# Patient Record
Sex: Female | Born: 1935 | Race: White | Hispanic: No | Marital: Single | Smoking: Never smoker
Health system: Southern US, Community
[De-identification: ages and names within clinical notes are randomized; demographics above are authoritative.]

## PROBLEM LIST (undated history)

## (undated) DIAGNOSIS — M549 Dorsalgia, unspecified: Secondary | ICD-10-CM

## (undated) DIAGNOSIS — K589 Irritable bowel syndrome without diarrhea: Secondary | ICD-10-CM

## (undated) DIAGNOSIS — Z9889 Other specified postprocedural states: Secondary | ICD-10-CM

## (undated) DIAGNOSIS — G8929 Other chronic pain: Secondary | ICD-10-CM

## (undated) DIAGNOSIS — M1990 Unspecified osteoarthritis, unspecified site: Secondary | ICD-10-CM

## (undated) DIAGNOSIS — K5790 Diverticulosis of intestine, part unspecified, without perforation or abscess without bleeding: Secondary | ICD-10-CM

## (undated) DIAGNOSIS — M810 Age-related osteoporosis without current pathological fracture: Secondary | ICD-10-CM

## (undated) DIAGNOSIS — N200 Calculus of kidney: Secondary | ICD-10-CM

## (undated) DIAGNOSIS — R112 Nausea with vomiting, unspecified: Secondary | ICD-10-CM

## (undated) DIAGNOSIS — C801 Malignant (primary) neoplasm, unspecified: Secondary | ICD-10-CM

## (undated) DIAGNOSIS — K219 Gastro-esophageal reflux disease without esophagitis: Secondary | ICD-10-CM

## (undated) DIAGNOSIS — M940 Chondrocostal junction syndrome [Tietze]: Secondary | ICD-10-CM

## (undated) DIAGNOSIS — E7800 Pure hypercholesterolemia, unspecified: Secondary | ICD-10-CM

## (undated) HISTORY — DX: Pure hypercholesterolemia, unspecified: E78.00

## (undated) HISTORY — PX: TONSILLECTOMY: SUR1361

## (undated) HISTORY — PX: COLONOSCOPY: SHX174

## (undated) HISTORY — PX: ABDOMINAL HYSTERECTOMY: SHX81

## (undated) HISTORY — DX: Diverticulosis of intestine, part unspecified, without perforation or abscess without bleeding: K57.90

## (undated) HISTORY — PX: EYE SURGERY: SHX253

## (undated) HISTORY — DX: Gastro-esophageal reflux disease without esophagitis: K21.9

## (undated) HISTORY — DX: Irritable bowel syndrome, unspecified: K58.9

## (undated) HISTORY — PX: FACIAL COSMETIC SURGERY: SHX629

## (undated) HISTORY — PX: CHOLECYSTECTOMY: SHX55

## (undated) HISTORY — DX: Age-related osteoporosis without current pathological fracture: M81.0

## (undated) HISTORY — PX: BREAST SURGERY: SHX581

---

## 1998-04-28 ENCOUNTER — Other Ambulatory Visit: Admission: RE | Admit: 1998-04-28 | Discharge: 1998-04-28 | Payer: Self-pay | Admitting: Obstetrics and Gynecology

## 1998-08-18 ENCOUNTER — Other Ambulatory Visit: Admission: RE | Admit: 1998-08-18 | Discharge: 1998-08-18 | Payer: Self-pay | Admitting: Obstetrics and Gynecology

## 1998-08-27 ENCOUNTER — Encounter: Payer: Self-pay | Admitting: Obstetrics and Gynecology

## 1998-08-31 ENCOUNTER — Inpatient Hospital Stay (HOSPITAL_COMMUNITY): Admission: RE | Admit: 1998-08-31 | Discharge: 1998-09-03 | Payer: Self-pay | Admitting: Obstetrics and Gynecology

## 1999-01-13 ENCOUNTER — Other Ambulatory Visit: Admission: RE | Admit: 1999-01-13 | Discharge: 1999-01-13 | Payer: Self-pay | Admitting: Obstetrics and Gynecology

## 1999-04-15 ENCOUNTER — Other Ambulatory Visit: Admission: RE | Admit: 1999-04-15 | Discharge: 1999-04-15 | Payer: Self-pay | Admitting: Obstetrics and Gynecology

## 1999-07-13 ENCOUNTER — Encounter: Admission: RE | Admit: 1999-07-13 | Discharge: 1999-07-13 | Payer: Self-pay | Admitting: Family Medicine

## 1999-07-13 ENCOUNTER — Encounter: Payer: Self-pay | Admitting: Family Medicine

## 1999-07-14 ENCOUNTER — Other Ambulatory Visit: Admission: RE | Admit: 1999-07-14 | Discharge: 1999-07-14 | Payer: Self-pay | Admitting: Obstetrics and Gynecology

## 1999-10-12 ENCOUNTER — Other Ambulatory Visit: Admission: RE | Admit: 1999-10-12 | Discharge: 1999-10-12 | Payer: Self-pay | Admitting: Obstetrics and Gynecology

## 2000-04-28 ENCOUNTER — Other Ambulatory Visit: Admission: RE | Admit: 2000-04-28 | Discharge: 2000-04-28 | Payer: Self-pay | Admitting: Obstetrics and Gynecology

## 2000-10-16 ENCOUNTER — Other Ambulatory Visit: Admission: RE | Admit: 2000-10-16 | Discharge: 2000-10-16 | Payer: Self-pay | Admitting: Obstetrics and Gynecology

## 2001-01-16 ENCOUNTER — Other Ambulatory Visit: Admission: RE | Admit: 2001-01-16 | Discharge: 2001-01-16 | Payer: Self-pay | Admitting: Obstetrics and Gynecology

## 2001-09-16 ENCOUNTER — Encounter: Payer: Self-pay | Admitting: Family Medicine

## 2001-09-16 ENCOUNTER — Encounter: Admission: RE | Admit: 2001-09-16 | Discharge: 2001-09-16 | Payer: Self-pay | Admitting: Family Medicine

## 2001-10-17 ENCOUNTER — Other Ambulatory Visit: Admission: RE | Admit: 2001-10-17 | Discharge: 2001-10-17 | Payer: Self-pay | Admitting: Internal Medicine

## 2002-12-23 ENCOUNTER — Encounter: Payer: Self-pay | Admitting: Gastroenterology

## 2002-12-23 ENCOUNTER — Encounter: Admission: RE | Admit: 2002-12-23 | Discharge: 2002-12-23 | Payer: Self-pay | Admitting: Gastroenterology

## 2002-12-24 ENCOUNTER — Encounter: Payer: Self-pay | Admitting: Surgery

## 2002-12-25 ENCOUNTER — Observation Stay (HOSPITAL_COMMUNITY): Admission: RE | Admit: 2002-12-25 | Discharge: 2002-12-26 | Payer: Self-pay | Admitting: Surgery

## 2002-12-25 ENCOUNTER — Encounter: Payer: Self-pay | Admitting: Surgery

## 2002-12-25 ENCOUNTER — Encounter (INDEPENDENT_AMBULATORY_CARE_PROVIDER_SITE_OTHER): Payer: Self-pay | Admitting: Specialist

## 2007-03-09 ENCOUNTER — Ambulatory Visit (HOSPITAL_COMMUNITY): Admission: RE | Admit: 2007-03-09 | Discharge: 2007-03-09 | Payer: Self-pay | Admitting: Family Medicine

## 2007-03-22 ENCOUNTER — Encounter: Admission: RE | Admit: 2007-03-22 | Discharge: 2007-03-22 | Payer: Self-pay | Admitting: Family Medicine

## 2007-09-20 ENCOUNTER — Encounter: Admission: RE | Admit: 2007-09-20 | Discharge: 2007-09-20 | Payer: Self-pay | Admitting: Gastroenterology

## 2008-03-11 ENCOUNTER — Ambulatory Visit (HOSPITAL_COMMUNITY): Admission: RE | Admit: 2008-03-11 | Discharge: 2008-03-11 | Payer: Self-pay | Admitting: Family Medicine

## 2009-03-18 ENCOUNTER — Ambulatory Visit (HOSPITAL_COMMUNITY): Admission: RE | Admit: 2009-03-18 | Discharge: 2009-03-18 | Payer: Self-pay | Admitting: Family Medicine

## 2010-03-24 ENCOUNTER — Ambulatory Visit (HOSPITAL_COMMUNITY): Admission: RE | Admit: 2010-03-24 | Discharge: 2010-03-24 | Payer: Self-pay | Admitting: Family Medicine

## 2010-10-29 NOTE — Op Note (Signed)
NAME:  Yolanda Phillips, Yolanda Phillips                          ACCOUNT NO.:  [id number]   MEDICAL RECORD NO.:  [id number]                   PATIENT TYPE:  AMB   LOCATION:  DAY                                  FACILITY:  [facility]   PHYSICIAN:  Currie Paris, M.D.           DATE OF BIRTH:  17-Aug-1930   DATE OF PROCEDURE:  12/25/2002  DATE OF DISCHARGE:                                 OPERATIVE REPORT   OFFICE MEDICAL RECORD NUMBER:  [id number]   PREOPERATIVE DIAGNOSIS:  Chronic and subacute calculus cholecystitis.   POSTOPERATIVE DIAGNOSIS:  Chronic and subacute calculus cholecystitis.   OPERATION:  Laparoscopic cholecystectomy with operative cholangiogram.   SURGEON:  Currie Paris, M.D.   ASSISTANTMadilyn Fireman, RNFA.   ANESTHESIA:  General.   CLINICAL HISTORY:  This patient is a 77 year old lady, who has had known  gallstones for many years and initially had planned to have surgery a couple  of years ago but became less symptomatic and had other surgical  interventions that took precedence.  However, she has now had fairly severe  pain for several days which was gradually easing off at the time we saw her  yesterday in the office.  Ultrasound showed a tightly packed gallbladder  filled with stones as well as a thick wall, suggesting some acute  cholecystitis.  Clinically, she was not toxic, and we felt that this was  subacute cholecystitis overlying a chronic calculus cholecystitis.  However,  because of her continued symptomatology, we elected to proceed to surgery.   DESCRIPTION OF PROCEDURE:  The patient was seen in the holding area and had  no further questions.  She was taken to the operating room and after  satisfactory general endotracheal anesthesia had been obtained, the abdomen  was prepped and draped.  Marcaine 0.25% plain was used for each incision.  The umbilical incision was made, the fascia opened, and the peritoneal  cavity entered under direct vision.  A pursestring was  placed and the Hasson  introduced and the abdomen insufflated to 15.  The patient was placed in  reverse Trendelenburg and additional local infiltrated as I put a 10-11 in  the epigastrium and two 5 mm laterally.  The gallbladder was tense and  distended.  I tried to aspirate and got only a few drops of bile out, as it  was primary stones.  It was very thick and chronically inflamed.  We  eventually were able to get a little bit of grasper on the fundus of the  gallbladder and another one just above the cystic duct, but there was a  stone there so again, we were having trouble grasping.  I was able to open  the peritoneum over the triangle of Calot with the cautery.  I dissected a  little bit of thickened peritoneum off.  I then was able to do some  dissection to find what appeared to be posterior branch  of the cystic  artery, anterior branch of the cystic artery, and the cystic duct.  Once we  had those dissected out, a nice window made, I clipped the posterior branch  of the cystic artery since it was readily accessible and then clipped the  cystic duct at its junction with the gallbladder.  The cystic duct was  opened, and a Cook catheter introduced percutaneously and operative  cholangiography done.  The common duct was a little dilated but otherwise  appeared to be normal with good filling of the duodenum and no filling  defects in filling up of the hepatic radicles.   The catheter was removed and three clips placed on the stay side of the  cystic duct, and it was divided.  Two additional clips were placed on the  cystic artery, divided, and the anterior branch likewise clipped and divided  with two clips on the stay side.  The gallbladder was removed from below to  above with coagulation current of the cautery and again, it was a very thick  wall peritoneum of chronic inflammation but came out fairly straightforward.  Just before disconnecting, we irrigated and made sure everything  was dry.  It was placed in a bag, and brought out the umbilical port.  I had to make  the umbilical incision larger to get this out.  Once it was out, we  reinsufflated, irrigated, and made sure everything was dry and pulled out  the lateral ports.  The umbilical port was closed with the pursestring  suture plus an additional suture of Vicryl.  The abdomen was deflated  through the epigastric port.  The skin was closed with 4-0 Monocryl  subcuticular and Dermabond.   The patient tolerated the procedure well, and there were no operative  complications.  All counts were correct.                                               Currie Paris, M.D.    CJS/MEDQ  D:  12/25/2002  T:  12/25/2002  Job:  [id number]   cc:   Bryan Lemma. Manus Gunning, M.D.  [address] [place]  [place] [address]  Fax: [phone]   Llana Aliment. Malon Kindle., M.D.  [address]  [place]  [place] [address]  Fax: [phone]

## 2011-03-12 ENCOUNTER — Emergency Department (HOSPITAL_COMMUNITY): Payer: Medicare Other

## 2011-03-12 ENCOUNTER — Emergency Department (HOSPITAL_COMMUNITY)
Admission: EM | Admit: 2011-03-12 | Discharge: 2011-03-12 | Disposition: A | Discharge status: Home / Self Care | Payer: Medicare Other | Attending: Emergency Medicine | Admitting: Emergency Medicine

## 2011-03-12 DIAGNOSIS — R112 Nausea with vomiting, unspecified: Secondary | ICD-10-CM | POA: Insufficient documentation

## 2011-03-12 DIAGNOSIS — R1031 Right lower quadrant pain: Secondary | ICD-10-CM | POA: Insufficient documentation

## 2011-03-12 LAB — DIFFERENTIAL
Basophils Relative: 0 % (ref 0–1)
Eosinophils Absolute: 0.1 10*3/uL (ref 0.0–0.7)
Lymphs Abs: 2 10*3/uL (ref 0.7–4.0)
Neutro Abs: 10.1 10*3/uL — ABNORMAL HIGH (ref 1.7–7.7)
Neutrophils Relative %: 76 % (ref 43–77)

## 2011-03-12 LAB — URINALYSIS, ROUTINE W REFLEX MICROSCOPIC
Glucose, UA: NEGATIVE mg/dL
Hgb urine dipstick: NEGATIVE
Ketones, ur: NEGATIVE mg/dL
Leukocytes, UA: NEGATIVE
Protein, ur: NEGATIVE mg/dL
Urobilinogen, UA: 0.2 mg/dL (ref 0.0–1.0)

## 2011-03-12 LAB — COMPREHENSIVE METABOLIC PANEL
ALT: 22 U/L (ref 0–35)
AST: 31 U/L (ref 0–37)
Alkaline Phosphatase: 54 U/L (ref 39–117)
CO2: 26 mEq/L (ref 19–32)
Chloride: 103 mEq/L (ref 96–112)
GFR calc non Af Amer: >60 mL/min (ref >60)
Glucose, Bld: 111 mg/dL — ABNORMAL HIGH (ref 70–99)
Sodium: 140 mEq/L (ref 135–145)
Total Bilirubin: 0.5 mg/dL (ref 0.3–1.2)

## 2011-03-12 LAB — CBC
Hemoglobin: 15.7 g/dL — ABNORMAL HIGH (ref 12.0–15.0)
Platelets: 254 10*3/uL (ref 150–400)
RBC: 5.13 MIL/uL — ABNORMAL HIGH (ref 3.87–5.11)
WBC: 13.3 10*3/uL — ABNORMAL HIGH (ref 4.0–10.5)

## 2011-03-15 ENCOUNTER — Other Ambulatory Visit: Payer: Self-pay | Admitting: Gastroenterology

## 2011-03-16 ENCOUNTER — Ambulatory Visit
Admission: RE | Admit: 2011-03-16 | Discharge: 2011-03-16 | Disposition: A | Discharge status: Home / Self Care | Payer: Medicare Other | Source: Ambulatory Visit | Attending: Gastroenterology | Admitting: Gastroenterology

## 2011-03-16 MED ORDER — IOHEXOL 300 MG/ML  SOLN
100.0000 mL | Freq: Once | INTRAMUSCULAR | Status: AC | PRN
  Administered 2011-03-16: 100 mL via INTRAVENOUS

## 2011-05-09 ENCOUNTER — Other Ambulatory Visit (HOSPITAL_COMMUNITY): Payer: Self-pay | Admitting: *Deleted

## 2011-05-10 ENCOUNTER — Encounter (HOSPITAL_COMMUNITY): Payer: Medicare Other

## 2011-05-11 ENCOUNTER — Encounter (HOSPITAL_COMMUNITY)
Admission: RE | Admit: 2011-05-11 | Discharge: 2011-05-11 | Disposition: A | Discharge status: Home / Self Care | Payer: Medicare Other | Source: Ambulatory Visit | Attending: Family Medicine | Admitting: Family Medicine

## 2011-05-11 DIAGNOSIS — M810 Age-related osteoporosis without current pathological fracture: Secondary | ICD-10-CM | POA: Insufficient documentation

## 2011-05-11 MED ORDER — ZOLEDRONIC ACID 5 MG/100ML IV SOLN
5.0000 mg | Freq: Once | INTRAVENOUS | Status: AC
  Administered 2011-05-11: 5 mg via INTRAVENOUS
  Filled 2011-05-11: qty 100

## 2012-05-04 ENCOUNTER — Encounter (HOSPITAL_COMMUNITY): Payer: Medicare Other

## 2012-05-14 ENCOUNTER — Encounter (HOSPITAL_COMMUNITY): Payer: Medicare Other

## 2012-05-15 ENCOUNTER — Other Ambulatory Visit (HOSPITAL_COMMUNITY): Payer: Self-pay | Admitting: *Deleted

## 2012-05-16 ENCOUNTER — Ambulatory Visit (HOSPITAL_COMMUNITY)
Admission: RE | Admit: 2012-05-16 | Discharge: 2012-05-16 | Disposition: A | Discharge status: Home / Self Care | Payer: Medicare Other | Source: Ambulatory Visit | Attending: Family Medicine | Admitting: Family Medicine

## 2012-05-16 DIAGNOSIS — IMO0002 Reserved for concepts with insufficient information to code with codable children: Secondary | ICD-10-CM | POA: Insufficient documentation

## 2012-05-16 MED ORDER — ZOLEDRONIC ACID 5 MG/100ML IV SOLN: 5.0000 mg | Freq: Once | INTRAVENOUS | Status: DC

## 2012-05-16 MED ORDER — ZOLEDRONIC ACID 5 MG/100ML IV SOLN
INTRAVENOUS | Status: AC
  Administered 2012-05-16: 5 mg
  Filled 2012-05-16: qty 100

## 2012-12-24 ENCOUNTER — Other Ambulatory Visit: Payer: Self-pay | Admitting: Family Medicine

## 2013-01-03 ENCOUNTER — Ambulatory Visit (INDEPENDENT_AMBULATORY_CARE_PROVIDER_SITE_OTHER): Payer: Medicare Other | Admitting: General Surgery

## 2013-01-03 ENCOUNTER — Encounter (INDEPENDENT_AMBULATORY_CARE_PROVIDER_SITE_OTHER): Payer: Self-pay | Admitting: General Surgery

## 2013-01-03 VITALS — BP 130/80 | HR 64 | Temp 98.4°F | Resp 15 | Ht 59.75 in | Wt 123.0 lb

## 2013-01-03 DIAGNOSIS — C439 Malignant melanoma of skin, unspecified: Secondary | ICD-10-CM

## 2013-01-03 NOTE — Progress Notes (Signed)
Patient ID: Yolanda Phillips, female   DOB: 07/07/1930, 79 y.o.   MRN: [id number]  Chief Complaint  Patient presents with  . New Evaluation    eval malignant melanoma on rt arm    HPI Yolanda Phillips is a 79 y.o. female.  Patient seen by her PCP for wellness exam 11/20/12 and her physician noted a lesion on radial side of rigth forearm near wrist. Patient had noted several changes in her skin over the last 10 years and that spot did not stand out in particular. She did not note any recent changes to the lesion.  It was not ulcerated per patient report.  She denies any family history of skin cancers and denies any other lumps or masses or suspicious lesions.  Patient returned for shave biopsy on 7/ 14/14 of the lesion by Dr. Ehinger and was found to have a malignant melanoma, superficial spreading, Clark level II, 0.3 mm.   Patient denies systemic symptoms such as unintentional weight loss, fevers/chills, night sweats.   HPI  Past Medical History  Diagnosis Date  . GERD (gastroesophageal reflux disease)   . Osteoporosis     Past Surgical History  Procedure Laterality Date  . Cholecystectomy    . Abdominal hysterectomy    . Facial cosmetic surgery      20 + years ago    History reviewed. No pertinent family history.  Social History History  Substance Use Topics  . Smoking status: Not on file  . Smokeless tobacco: Never Used  . Alcohol Use: No    Allergies  Allergen Reactions  . Morphine And Related Other (See Comments)    Made patient very dizzy    Current Outpatient Prescriptions  Medication Sig Dispense Refill  . calcium carbonate (OS-CAL) 600 MG TABS Take 1,200 mg by mouth once.      . COD LIVER OIL PO Take by mouth.      . Multiple Vitamins-Minerals (ONE-A-DAY WOMENS 50 PLUS PO) Take by mouth.      . pantoprazole (PROTONIX) 40 MG tablet        No current facility-administered medications for this visit.    Review of Systems Review of Systems All other review of  systems negative or noncontributory except as stated in the HPI  Blood pressure 130/80, pulse 64, temperature 98.4 F (36.9 C), temperature source Temporal, resp. rate 15, height 4' 11.75" (1.518 m), weight 123 lb (55.792 kg).  Physical Exam Physical Exam Nursing note and vitals reviewed. Constitutional: She is oriented to person, place, and time. She appears well-developed and well-nourished. No distress.  HENT:  Head: Normocephalic and atraumatic.  Mouth/Throat: No oropharyngeal exudate.  Eyes: Conjunctivae and EOM are normal. Pupils are equal, round, and reactive to light. Right eye exhibits no discharge. Left eye exhibits no discharge. No scleral icterus.  Neck: Normal range of motion. Neck supple. No tracheal deviation present.  Cardiovascular: Normal rate, regular rhythm, normal heart sounds and intact distal pulses.   Pulmonary/Chest: Effort normal and breath sounds normal. No stridor. No respiratory distress. She has no wheezes.  Abdominal: Soft. Bowel sounds are normal. She exhibits no distension and no mass. There is no tenderness. There is no rebound and no guarding.  Musculoskeletal: Normal range of motion. She exhibits no edema and no tenderness.  Neurological: She is alert and oriented to person, place, and time.  Skin: Skin is warm and dry. She is not diaphoretic. No erythema. No pallor. Small approximately 4-5mm by 3-4 mm area on   right forearm near the wrist s/p shave biopsy-healing well. A complete skin exam was completed sparing the breasts was performed. Several seborrheic keratosis were noted but no other areas noted worrisome for melanoma.  Psychiatric: She has a normal mood and affect. Her behavior is normal. Judgment and thought content normal.  Lymph: no evidence of LAD in the epitrochlear or axillary or supraclavicular nodes  Data Reviewed Pathology report dated 12/25/12 for report and 12/24/12 for collection  Assessment    Malignant Melanoma This appears to be a 3  mm superficial spreading malignant melanoma. She has no other risk factors concerning for nodal involvement. There was no evidence of ulcerations and a low number of mitoses. Given the depth of these factors as well as her age I would not necessarily recommend any sentinel lymph node biopsy be associated with this. I would however recommend wide local excision and I have discussed this with her. I discussed with her the procedure and the risks including infection, bleeding, pain, scarring, recurrence, nerve injury, inability to close the wound of requiring skin grafting or chronic wound care and she expressed understanding and desired to proceed with wide local excision of her right wrist melanoma. I did not see any other suspicious lesions that are concerning for biopsy on exam.    Plan    Will plan on wide local excision.  I will also obtain CXR and LFT's including LDH.        Yolanda Phillips 01/03/2013, 3:41 PM    

## 2013-01-07 ENCOUNTER — Encounter (HOSPITAL_COMMUNITY): Payer: Self-pay | Admitting: Pharmacy Technician

## 2013-01-09 ENCOUNTER — Encounter (HOSPITAL_COMMUNITY): Payer: Self-pay

## 2013-01-09 ENCOUNTER — Encounter (HOSPITAL_COMMUNITY)
Admission: RE | Admit: 2013-01-09 | Discharge: 2013-01-09 | Disposition: A | Discharge status: Home / Self Care | Payer: Medicare Other | Source: Ambulatory Visit | Attending: General Surgery | Admitting: General Surgery

## 2013-01-09 ENCOUNTER — Ambulatory Visit (INDEPENDENT_AMBULATORY_CARE_PROVIDER_SITE_OTHER): Payer: Self-pay | Admitting: General Surgery

## 2013-01-09 VITALS — BP 152/81 | HR 64 | Temp 97.7°F | Resp 20 | Wt 125.4 lb

## 2013-01-09 DIAGNOSIS — C439 Malignant melanoma of skin, unspecified: Secondary | ICD-10-CM

## 2013-01-09 HISTORY — DX: Other specified postprocedural states: Z98.890

## 2013-01-09 HISTORY — DX: Nausea with vomiting, unspecified: R11.2

## 2013-01-09 LAB — COMPREHENSIVE METABOLIC PANEL
ALT: 14 U/L (ref 0–35)
AST: 20 U/L (ref 0–37)
Albumin: 3.7 g/dL (ref 3.5–5.2)
Calcium: 10.3 mg/dL (ref 8.4–10.5)
Creatinine, Ser: 0.7 mg/dL (ref 0.50–1.10)
GFR calc non Af Amer: 80 mL/min — ABNORMAL LOW (ref >90)
Sodium: 140 mEq/L (ref 135–145)
Total Protein: 7 g/dL (ref 6.0–8.3)

## 2013-01-09 LAB — CBC WITH DIFFERENTIAL/PLATELET
Basophils Absolute: 0 10*3/uL (ref 0.0–0.1)
Basophils Relative: 0 % (ref 0–1)
Eosinophils Relative: 1 % (ref 0–5)
HCT: 43.5 % (ref 36.0–46.0)
Lymphocytes Relative: 32 % (ref 12–46)
MCHC: 34.9 g/dL (ref 30.0–36.0)
MCV: 88.4 fL (ref 78.0–100.0)
Monocytes Absolute: 1 10*3/uL (ref 0.1–1.0)
Platelets: 269 10*3/uL (ref 150–400)
RDW: 13 % (ref 11.5–15.5)
WBC: 8.3 10*3/uL (ref 4.0–10.5)

## 2013-01-09 NOTE — Pre-Procedure Instructions (Signed)
Yolanda Phillips  01/09/2013   Your procedure is scheduled on:  January 14, 2013  Report to [facility] Short Stay Center at 5:30 AM.  Call this number if you have problems the morning of surgery: [phone]   Remember:   Do not eat food or drink liquids after midnight.   Take these medicines the morning of surgery with A SIP OF WATER: pantoprazole (PROTONIX) 40 MG tablet   Do not wear jewelry, make-up or nail polish.  Do not wear lotions, powders, or perfumes. You may wear deodorant.  Do not shave 48 hours prior to surgery. Men may shave face and neck.  Do not bring valuables to the hospital.  [facility] is not responsible for any belongings or valuables.  Contacts, dentures or bridgework may not be worn into surgery.  Leave suitcase in the car. After surgery it may be brought to your room.  For patients admitted to the hospital, checkout time is 11:00 AM the day of  discharge.   Patients discharged the day of surgery will not be allowed to drive  home.  Name and phone number of your driver:   Special Instructions: Shower using CHG 2 nights before surgery and the night before surgery.  If you shower the day of surgery use CHG.  Use special wash - you have one bottle of CHG for all showers.  You should use approximately 1/3 of the bottle for each shower.   Please read over the following fact sheets that you were given: Pain Booklet, Coughing and Deep Breathing and Surgical Site Infection Prevention

## 2013-01-10 NOTE — Progress Notes (Signed)
Anesthesia Chart Review:  Patient is a 79 year old female scheduled for wide local excision of right wrist melanoma by Dr. Biagio Quint. History includes GERD, osteoporosis, facial cosmetic surgery, cholecystectomy, hysterectomy, non-smoker, post-operative N/V.  PCP is Dr. Blair Heys.  CXR on 01/09/13 showed: No acute cariopulmonary findings. Thoracic spine compression fracture of uncertain age.  Preoperative labs noted.    She did not meet anesthesia's criteria for an EKG preoperatively.    She will be evaluated by her assigned anesthesiologist on the day of surgery to discuss the definitive anesthesia plan.  Velna Ochs [facility] Short Stay Center/Anesthesiology Phone [phone] 01/10/2013 9:54 AM

## 2013-01-13 MED ORDER — CEFAZOLIN SODIUM-DEXTROSE 2-3 GM-% IV SOLR
2.0000 g | INTRAVENOUS | Status: DC
  Filled 2013-01-13: qty 50

## 2013-01-14 ENCOUNTER — Ambulatory Visit (HOSPITAL_COMMUNITY): Payer: Medicare Other | Admitting: Anesthesiology

## 2013-01-14 ENCOUNTER — Encounter (HOSPITAL_COMMUNITY)
Admission: RE | Disposition: A | Discharge status: Home / Self Care | Payer: Self-pay | Source: Ambulatory Visit | Attending: General Surgery

## 2013-01-14 ENCOUNTER — Encounter (HOSPITAL_COMMUNITY): Payer: Self-pay | Admitting: *Deleted

## 2013-01-14 ENCOUNTER — Encounter (HOSPITAL_COMMUNITY): Payer: Self-pay | Admitting: Vascular Surgery

## 2013-01-14 ENCOUNTER — Ambulatory Visit (HOSPITAL_COMMUNITY)
Admission: RE | Admit: 2013-01-14 | Discharge: 2013-01-14 | Disposition: A | Discharge status: Home / Self Care | Payer: Medicare Other | Source: Ambulatory Visit | Attending: General Surgery | Admitting: General Surgery

## 2013-01-14 DIAGNOSIS — Z885 Allergy status to narcotic agent status: Secondary | ICD-10-CM | POA: Insufficient documentation

## 2013-01-14 DIAGNOSIS — C4360 Malignant melanoma of unspecified upper limb, including shoulder: Secondary | ICD-10-CM

## 2013-01-14 DIAGNOSIS — Z90710 Acquired absence of both cervix and uterus: Secondary | ICD-10-CM | POA: Insufficient documentation

## 2013-01-14 DIAGNOSIS — K219 Gastro-esophageal reflux disease without esophagitis: Secondary | ICD-10-CM | POA: Insufficient documentation

## 2013-01-14 DIAGNOSIS — C439 Malignant melanoma of skin, unspecified: Secondary | ICD-10-CM

## 2013-01-14 DIAGNOSIS — M810 Age-related osteoporosis without current pathological fracture: Secondary | ICD-10-CM | POA: Insufficient documentation

## 2013-01-14 HISTORY — PX: MELANOMA EXCISION: SHX5266

## 2013-01-14 SURGERY — EXCISION, MELANOMA
Anesthesia: General | Site: Wrist | Laterality: Right | Wound class: Clean

## 2013-01-14 MED ORDER — MIDAZOLAM HCL 5 MG/5ML IJ SOLN
INTRAMUSCULAR | Status: DC | PRN
  Administered 2013-01-14: 1 mg via INTRAVENOUS

## 2013-01-14 MED ORDER — BUPIVACAINE-EPINEPHRINE 0.25% -1:200000 IJ SOLN
INTRAMUSCULAR | Status: DC | PRN
  Administered 2013-01-14: 30 mL

## 2013-01-14 MED ORDER — 0.9 % SODIUM CHLORIDE (POUR BTL) OPTIME
TOPICAL | Status: DC | PRN
  Administered 2013-01-14: 1000 mL

## 2013-01-14 MED ORDER — BUPIVACAINE-EPINEPHRINE PF 0.25-1:200000 % IJ SOLN
INTRAMUSCULAR | Status: AC
  Filled 2013-01-14: qty 30

## 2013-01-14 MED ORDER — LIDOCAINE HCL (PF) 1 % IJ SOLN
INTRAMUSCULAR | Status: DC | PRN
  Administered 2013-01-14: 30 mL

## 2013-01-14 MED ORDER — HYDROCODONE-ACETAMINOPHEN 5-325 MG PO TABS: 1.0000 | ORAL_TABLET | Freq: Four times a day (QID) | ORAL | Status: DC | PRN

## 2013-01-14 MED ORDER — FENTANYL CITRATE 0.05 MG/ML IJ SOLN
INTRAMUSCULAR | Status: DC | PRN
  Administered 2013-01-14: 50 ug via INTRAVENOUS

## 2013-01-14 MED ORDER — ONDANSETRON HCL 4 MG/2ML IJ SOLN
INTRAMUSCULAR | Status: DC | PRN
  Administered 2013-01-14: 4 mg via INTRAVENOUS

## 2013-01-14 MED ORDER — LACTATED RINGERS IV SOLN
INTRAVENOUS | Status: DC | PRN
  Administered 2013-01-14: 07:00:00 via INTRAVENOUS

## 2013-01-14 MED ORDER — PROPOFOL INFUSION 10 MG/ML OPTIME
INTRAVENOUS | Status: DC | PRN
  Administered 2013-01-14: 75 ug/kg/min via INTRAVENOUS

## 2013-01-14 MED ORDER — LIDOCAINE HCL (PF) 1 % IJ SOLN
INTRAMUSCULAR | Status: AC
  Filled 2013-01-14: qty 30

## 2013-01-14 SURGICAL SUPPLY — 57 items
BANDAGE ELASTIC 3 VELCRO ST LF (GAUZE/BANDAGES/DRESSINGS) ×2 IMPLANT
BANDAGE GAUZE ELAST BULKY 4 IN (GAUZE/BANDAGES/DRESSINGS) ×2 IMPLANT
BENZOIN TINCTURE PRP APPL 2/3 (GAUZE/BANDAGES/DRESSINGS) IMPLANT
BLADE SURG 10 STRL SS (BLADE) ×2 IMPLANT
BLADE SURG 15 STRL LF DISP TIS (BLADE) ×1 IMPLANT
BLADE SURG 15 STRL SS (BLADE) ×1
BLADE SURG ROTATE 9660 (MISCELLANEOUS) IMPLANT
CANISTER SUCTION 2500CC (MISCELLANEOUS) IMPLANT
CHLORAPREP W/TINT 26ML (MISCELLANEOUS) ×2 IMPLANT
CLOTH BEACON ORANGE TIMEOUT ST (SAFETY) ×2 IMPLANT
COVER PROBE W GEL 5X96 (DRAPES) IMPLANT
COVER SURGICAL LIGHT HANDLE (MISCELLANEOUS) ×2 IMPLANT
DECANTER SPIKE VIAL GLASS SM (MISCELLANEOUS) IMPLANT
DRAPE EXTREMITY T 121X128X90 (DRAPE) ×4 IMPLANT
DRAPE ORTHO SPLIT 77X108 STRL (DRAPES)
DRAPE PED LAPAROTOMY (DRAPES) IMPLANT
DRAPE SURG ORHT 6 SPLT 77X108 (DRAPES) IMPLANT
DRAPE U-SHAPE 47X51 STRL (DRAPES) ×2 IMPLANT
ELECT CAUTERY BLADE 6.4 (BLADE) ×2 IMPLANT
ELECT REM PT RETURN 9FT ADLT (ELECTROSURGICAL) ×2
ELECTRODE REM PT RTRN 9FT ADLT (ELECTROSURGICAL) ×1 IMPLANT
GAUZE SPONGE 4X4 16PLY XRAY LF (GAUZE/BANDAGES/DRESSINGS) ×2 IMPLANT
GLOVE BIO SURGEON STRL SZ7 (GLOVE) ×2 IMPLANT
GLOVE BIO SURGEON STRL SZ7.5 (GLOVE) ×2 IMPLANT
GLOVE BIOGEL PI IND STRL 7.5 (GLOVE) ×1 IMPLANT
GLOVE BIOGEL PI INDICATOR 7.5 (GLOVE) ×1
GLOVE SS N UNI LF 7.5 STRL (GLOVE) ×4 IMPLANT
GOWN STRL NON-REIN LRG LVL3 (GOWN DISPOSABLE) ×2 IMPLANT
GOWN STRL REIN XL XLG (GOWN DISPOSABLE) ×2 IMPLANT
KIT BASIN OR (CUSTOM PROCEDURE TRAY) ×2 IMPLANT
KIT ROOM TURNOVER OR (KITS) ×2 IMPLANT
NEEDLE HYPO 25GX1X1/2 BEV (NEEDLE) ×2 IMPLANT
NS IRRIG 1000ML POUR BTL (IV SOLUTION) ×2 IMPLANT
PACK SURGICAL SETUP 50X90 (CUSTOM PROCEDURE TRAY) ×2 IMPLANT
PAD ARMBOARD 7.5X6 YLW CONV (MISCELLANEOUS) ×2 IMPLANT
PEN SKIN MARKING BROAD (MISCELLANEOUS) ×2 IMPLANT
PENCIL BUTTON HOLSTER BLD 10FT (ELECTRODE) ×2 IMPLANT
SPECIMEN JAR MEDIUM (MISCELLANEOUS) ×2 IMPLANT
SPONGE GAUZE 4X4 12PLY (GAUZE/BANDAGES/DRESSINGS) IMPLANT
SPONGE LAP 18X18 X RAY DECT (DISPOSABLE) ×2 IMPLANT
STRIP CLOSURE SKIN 1/2X4 (GAUZE/BANDAGES/DRESSINGS) IMPLANT
SUT ETHILON 2 0 FS 18 (SUTURE) ×2 IMPLANT
SUT ETHILON 3 0 FSL (SUTURE) IMPLANT
SUT MNCRL AB 4-0 PS2 18 (SUTURE) IMPLANT
SUT SILK 2 0 FS (SUTURE) ×2 IMPLANT
SUT VIC AB 2-0 SH 27 (SUTURE)
SUT VIC AB 2-0 SH 27X BRD (SUTURE) IMPLANT
SUT VIC AB 3-0 SH 27 (SUTURE) ×1
SUT VIC AB 3-0 SH 27XBRD (SUTURE) ×1 IMPLANT
SYR BULB 3OZ (MISCELLANEOUS) ×2 IMPLANT
SYR CONTROL 10ML LL (SYRINGE) ×2 IMPLANT
TOWEL OR 17X24 6PK STRL BLUE (TOWEL DISPOSABLE) IMPLANT
TOWEL OR 17X26 10 PK STRL BLUE (TOWEL DISPOSABLE) ×2 IMPLANT
TUBE CONNECTING 12X1/4 (SUCTIONS) ×2 IMPLANT
UNDERPAD 30X30 INCONTINENT (UNDERPADS AND DIAPERS) ×2 IMPLANT
WATER STERILE IRR 1000ML POUR (IV SOLUTION) IMPLANT
YANKAUER SUCT BULB TIP NO VENT (SUCTIONS) ×2 IMPLANT

## 2013-01-14 NOTE — H&P (View-Only) (Signed)
Patient ID: Yolanda Phillips, female   DOB: [birth year], 79 y.o.   MRN: [id number]  Chief Complaint  Patient presents with  . New Evaluation    eval malignant melanoma on rt arm    HPI Yolanda Phillips is a 79 y.o. female.  Patient seen by her PCP for wellness exam 11/20/12 and her physician noted a lesion on radial side of rigth forearm near wrist. Patient had noted several changes in her skin over the last 10 years and that spot did not stand out in particular. She did not note any recent changes to the lesion.  It was not ulcerated per patient report.  She denies any family history of skin cancers and denies any other lumps or masses or suspicious lesions.  Patient returned for shave biopsy on 7/ 14/14 of the lesion by Dr. Manus Gunning and was found to have a malignant melanoma, superficial spreading, Clark level II, 0.3 mm.   Patient denies systemic symptoms such as unintentional weight loss, fevers/chills, night sweats.   HPI  Past Medical History  Diagnosis Date  . GERD (gastroesophageal reflux disease)   . Osteoporosis     Past Surgical History  Procedure Laterality Date  . Cholecystectomy    . Abdominal hysterectomy    . Facial cosmetic surgery      20 + years ago    History reviewed. No pertinent family history.  Social History History  Substance Use Topics  . Smoking status: Not on file  . Smokeless tobacco: Never Used  . Alcohol Use: No    Allergies  Allergen Reactions  . Morphine And Related Other (See Comments)    Made patient very dizzy    Current Outpatient Prescriptions  Medication Sig Dispense Refill  . calcium carbonate (OS-CAL) 600 MG TABS Take 1,200 mg by mouth once.      . COD LIVER OIL PO Take by mouth.      . Multiple Vitamins-Minerals (ONE-A-DAY WOMENS 50 PLUS PO) Take by mouth.      . pantoprazole (PROTONIX) 40 MG tablet        No current facility-administered medications for this visit.    Review of Systems Review of Systems All other review of  systems negative or noncontributory except as stated in the HPI  Blood pressure 130/80, pulse 64, temperature 98.4 F (36.9 C), temperature source Temporal, resp. rate 15, height 4' 11.75" (1.518 m), weight 123 lb (55.792 kg).  Physical Exam Physical Exam Nursing note and vitals reviewed. Constitutional: She is oriented to person, place, and time. She appears well-developed and well-nourished. No distress.  HENT:  Head: Normocephalic and atraumatic.  Mouth/Throat: No oropharyngeal exudate.  Eyes: Conjunctivae and EOM are normal. Pupils are equal, round, and reactive to light. Right eye exhibits no discharge. Left eye exhibits no discharge. No scleral icterus.  Neck: Normal range of motion. Neck supple. No tracheal deviation present.  Cardiovascular: Normal rate, regular rhythm, normal heart sounds and intact distal pulses.   Pulmonary/Chest: Effort normal and breath sounds normal. No stridor. No respiratory distress. She has no wheezes.  Abdominal: Soft. Bowel sounds are normal. She exhibits no distension and no mass. There is no tenderness. There is no rebound and no guarding.  Musculoskeletal: Normal range of motion. She exhibits no edema and no tenderness.  Neurological: She is alert and oriented to person, place, and time.  Skin: Skin is warm and dry. She is not diaphoretic. No erythema. No pallor. Small approximately 4-16mm by 3-4 mm area on  right forearm near the wrist s/p shave biopsy-healing well. A complete skin exam was completed sparing the breasts was performed. Several seborrheic keratosis were noted but no other areas noted worrisome for melanoma.  Psychiatric: She has a normal mood and affect. Her behavior is normal. Judgment and thought content normal.  Lymph: no evidence of LAD in the epitrochlear or axillary or supraclavicular nodes  Data Reviewed Pathology report dated 12/25/12 for report and 12/24/12 for collection  Assessment    Malignant Melanoma This appears to be a 3  mm superficial spreading malignant melanoma. She has no other risk factors concerning for nodal involvement. There was no evidence of ulcerations and a low number of mitoses. Given the depth of these factors as well as her age I would not necessarily recommend any sentinel lymph node biopsy be associated with this. I would however recommend wide local excision and I have discussed this with her. I discussed with her the procedure and the risks including infection, bleeding, pain, scarring, recurrence, nerve injury, inability to close the wound of requiring skin grafting or chronic wound care and she expressed understanding and desired to proceed with wide local excision of her right wrist melanoma. I did not see any other suspicious lesions that are concerning for biopsy on exam.    Plan    Will plan on wide local excision.  I will also obtain CXR and LFT's including LDH.        Lodema Pilot DAVID 01/03/2013, 3:41 PM

## 2013-01-14 NOTE — Anesthesia Preprocedure Evaluation (Addendum)
Anesthesia Evaluation  Patient identified by MRN, date of birth, ID band Patient awake    History of Anesthesia Complications (+) PONV  Airway Mallampati: II  Neck ROM: Full    Dental  (+) Teeth Intact   Pulmonary neg pulmonary ROS,  breath sounds clear to auscultation        Cardiovascular negative cardio ROS  Rhythm:Regular Rate:Normal     Neuro/Psych    GI/Hepatic GERD-  ,  Endo/Other    Renal/GU      Musculoskeletal   Abdominal   Peds  Hematology   Anesthesia Other Findings   Reproductive/Obstetrics                          Anesthesia Physical Anesthesia Plan  ASA: I  Anesthesia Plan: General   Post-op Pain Management:    Induction: Intravenous  Airway Management Planned: LMA  Additional Equipment:   Intra-op Plan:   Post-operative Plan: Extubation in OR  Informed Consent: I have reviewed the patients History and Physical, chart, labs and discussed the procedure including the risks, benefits and alternatives for the proposed anesthesia with the patient or authorized representative who has indicated his/her understanding and acceptance.     Plan Discussed with: CRNA and Surgeon  Anesthesia Plan Comments:         Anesthesia Quick Evaluation

## 2013-01-14 NOTE — Transfer of Care (Signed)
Immediate Anesthesia Transfer of Care Note  Patient: Yolanda Phillips  Procedure(s) Performed: Procedure(s): WIDE LOCAL EXCISION RIGHT WRIST MELANOMA  (Right)  Patient Location: PACU  Anesthesia Type:MAC  Level of Consciousness: awake, alert , oriented and patient cooperative  Airway & Oxygen Therapy: Patient Spontanous Breathing  Post-op Assessment: Report given to PACU RN, Post -op Vital signs reviewed and stable and Patient moving all extremities  Post vital signs: Reviewed and stable  Complications: No apparent anesthesia complications

## 2013-01-14 NOTE — Progress Notes (Signed)
Dr. Jacklynn Bue notified pt had some sips of coffee this morning.

## 2013-01-14 NOTE — Preoperative (Signed)
Beta Blockers   Reason not to administer Beta Blockers:Not Applicable 

## 2013-01-14 NOTE — Anesthesia Procedure Notes (Signed)
Procedure Name: MAC Date/Time: 01/14/2013 7:25 AM Performed by: Jerilee Hoh Pre-anesthesia Checklist: Patient identified, Emergency Drugs available, Suction available and Patient being monitored Patient Re-evaluated:Patient Re-evaluated prior to inductionOxygen Delivery Method: Simple face mask Intubation Type: IV induction Placement Confirmation: positive ETCO2 and breath sounds checked- equal and bilateral

## 2013-01-14 NOTE — Brief Op Note (Signed)
01/14/2013  8:21 AM  PATIENT:  Yolanda Phillips  79 y.o. female  PRE-OPERATIVE DIAGNOSIS:  MELANOMA RIGHT WRIST  POST-OPERATIVE DIAGNOSIS:  MELANOMA RIGHT WRIST  PROCEDURE:  Procedure(s): WIDE LOCAL EXCISION RIGHT WRIST MELANOMA  (Right)  SURGEON:  Surgeon(s) and Role:    * Lodema Pilot, DO - Primary  PHYSICIAN ASSISTANT:   ASSISTANTS: none   ANESTHESIA:   IV sedation  EBL:  Total I/O In: 500 [I.V.:500] Out: 5 [Blood:5]  BLOOD ADMINISTERED:none  DRAINS: none   LOCAL MEDICATIONS USED:  MARCAINE    and LIDOCAINE   SPECIMEN:  Source of Specimen:  WLE of right wrist melanoma, suture proximal  DISPOSITION OF SPECIMEN:  PATHOLOGY  COUNTS:  YES  TOURNIQUET:  * No tourniquets in log *  DICTATION: .Other Dictation: Dictation Number [id number]  PLAN OF CARE: Discharge to home after PACU  PATIENT DISPOSITION:  PACU - hemodynamically stable.   Delay start of Pharmacological VTE agent (>24hrs) due to surgical blood loss or risk of bleeding: no

## 2013-01-14 NOTE — Op Note (Signed)
NAME:  Yolanda Phillips, Yolanda Phillips NO.:  [id number]  MEDICAL RECORD NO.:  [id number]  LOCATION:  [facility]                         FACILITY:  [facility]  PHYSICIAN:  Lodema Pilot, MD       DATE OF BIRTH:  08-Jun-1931  DATE OF PROCEDURE:  01/14/2013 DATE OF DISCHARGE:                              OPERATIVE REPORT   PROCEDURE:  Wide local excision of right wrist melanoma.  PREOPERATIVE DIAGNOSIS:  Melanoma.  POSTOPERATIVE DIAGNOSIS:  Melanoma.  SURGEON:  Lodema Pilot, MD  ASSISTANT:  None.  ANESTHESIA:  20 mL of 1% lidocaine and 0.25% Marcaine with epinephrine in a 50:50 mixture and moderate sedation.  FLUIDS:  500 mL crystalloid.  ESTIMATED BLOOD LOSS:  Minimal.  DRAINS:  None.  SPECIMENS:  Wide local excision of right wrist melanoma sent to Pathology for permanent dissection with the suture marking in the proximal margin.  COMPLICATIONS:  None apparent.  FINDINGS:  Wide local excision of  prior melanoma and scar with at least 1 cm margins circumferentially, suture marking the proximal margin.  INDICATION:  Ms. Micallef is an 79 year old female with a 0.3 mm melanoma, who was referred for wide local excision.  OPERATIVE DETAILS:  Ms. Waldrip was seen and evaluated in the preoperative area and risks and benefits of procedure were again discussed in lay terms.  Informed consent was obtained.  Surgical site was marked prior to anesthetic administration.  She was taken to the operating room, placed on table in supine position and moderate sedation was obtained.  Her right arm and hand and wrist were prepped and draped in a standard surgical fashion.  Procedure time-out was performed with all operative team members to confirm proper patient, procedure, because this was a thin melanoma with only 0.3 mm depth and marked out 1 cm margins circumferentially from the edge of the prior biopsy scar and any visible lesion, marked out this circular rim and then on the proximal and  distal aspects of this extend for another 2 cm in order to make elliptical wound in order to facilitate skin closure, so the actual excised tissue measured over 6 cm longitudinally and 3 cm in width. This ellipse of tissue was excised down to the underlying fascia and knee, skin and subcutaneous tissue were undermined and along the edge of the fascia taking care to avoid injury to any neurovascular structures. The muscle and fascia were visualized as well as superficial nerves, that was infiltrated elevated this from the subcutaneous tissue from this nerve and we were able to preserve the for the length of the incision.  The specimen was marked on the proximal apex of the ellipse and completely removed and sent to Pathology for permanent sectioning. The wound was then inspected for hemostasis, which was obtained with Bovie electrocautery and a small amount of undermining was performed medially and laterally in order to facilitate closure and then interrupted 3-0 Vicryl sutures were placed through the dermis to approximate the skin edges, and take some tension off the wound and then 2-0 nylon vertical mattress sutures were used to approximate the skin edges.  The skin edges were well approximated and the wound was well approximated.  The skin was washed and dried and a sterile dressing was applied.  All sponge, needle, and instrument counts correct in the case. The patient tolerated the procedure well without apparent complications.          ______________________________ Lodema Pilot, MD     BL/MEDQ  D:  01/14/2013  T:  01/14/2013  Job:  [id number]

## 2013-01-14 NOTE — Interval H&P Note (Signed)
History and Physical Interval Note:  01/14/2013 7:12 AM  Yolanda Phillips  has presented today for surgery, with the diagnosis of melanoma right wrist  The various methods of treatment have been discussed with the patient and family. After consideration of risks, benefits and other options for treatment, the patient has consented to  Procedure(s): WIDE LOCAL EXCISION RIGHT WRIST MELANOMA  (Right) as a surgical intervention .  The patient's history has been reviewed, patient examined, no change in status, stable for surgery.  I have reviewed the patient's chart and labs.  Questions were answered to the patient's satisfaction. I have seen and evaluated this patient.  Site marked with the patient.  Risks of the procedure again discussed including infection, bleeding, pain, scarring, recurrence, nerve injury, need for skin graft or wound care and she expressed understanding and desires to proceed with wide local excision of right wrist melanoma.    Lodema Pilot DAVID

## 2013-01-14 NOTE — Anesthesia Postprocedure Evaluation (Signed)
  Anesthesia Post-op Note  Patient: Yolanda Phillips  Procedure(s) Performed: Procedure(s): WIDE LOCAL EXCISION RIGHT WRIST MELANOMA  (Right)  Patient Location: PACU  Anesthesia Type:MAC  Level of Consciousness: awake  Airway and Oxygen Therapy: Patient Spontanous Breathing  Post-op Pain: none  Post-op Assessment: Post-op Vital signs reviewed  Post-op Vital Signs: stable  Complications: No apparent anesthesia complications

## 2013-01-15 ENCOUNTER — Encounter (HOSPITAL_COMMUNITY): Payer: Self-pay | Admitting: General Surgery

## 2013-01-18 ENCOUNTER — Ambulatory Visit (INDEPENDENT_AMBULATORY_CARE_PROVIDER_SITE_OTHER): Payer: Medicare Other | Admitting: General Surgery

## 2013-01-18 ENCOUNTER — Encounter (INDEPENDENT_AMBULATORY_CARE_PROVIDER_SITE_OTHER): Payer: Self-pay | Admitting: General Surgery

## 2013-01-18 VITALS — BP 144/82 | HR 84 | Resp 18 | Ht 59.0 in | Wt 123.0 lb

## 2013-01-18 DIAGNOSIS — Z4889 Encounter for other specified surgical aftercare: Secondary | ICD-10-CM

## 2013-01-18 DIAGNOSIS — Z5189 Encounter for other specified aftercare: Secondary | ICD-10-CM

## 2013-01-18 NOTE — Progress Notes (Signed)
Subjective:     Patient ID: Yolanda Phillips, female   DOB: Feb 19, 1931, 79 y.o.   MRN: [id number]  HPI This patient follows up 4 days status post reexcision of right wrist melanoma. Her pathology was negative for any residual melanoma. She seems to be doing okay but had a reaction to the pain medicine initially with some nausea and vomiting and really did not feel well with this. Since then she has not been taking anything by Tylenol. She has some throbbing discomfort today but is not taking any medication for this. She denies any fevers or chills.  Review of Systems     Objective:   Physical Exam Her incision is healing nicely without any sign of infection. There is small amount of skin bleeding from the more cephalad portion of the incision but again there is no erythema of her significant tenderness. The dressing was reapplied    Assessment:     Status post reexcision of right wrist melanoma-doing well She is recovering fine from her procedure. Her pathology showed no residual melanoma. I checked her to change the dressing with a clean dry gauze daily and I do not think that she will need any additional pain medication. She will see [place] back in one week for suture removal. She should not need any further reexcision.     Plan:     Continue local wound care and she will followup with me in one week for suture removal

## 2013-01-25 ENCOUNTER — Encounter (INDEPENDENT_AMBULATORY_CARE_PROVIDER_SITE_OTHER): Payer: Self-pay

## 2013-01-25 ENCOUNTER — Ambulatory Visit (INDEPENDENT_AMBULATORY_CARE_PROVIDER_SITE_OTHER): Payer: Medicare Other

## 2013-01-25 VITALS — BP 118/76 | HR 68 | Temp 98.0°F | Resp 18 | Ht 59.0 in | Wt 125.0 lb

## 2013-01-25 DIAGNOSIS — Z4802 Encounter for removal of sutures: Secondary | ICD-10-CM

## 2013-01-25 NOTE — Progress Notes (Signed)
Patient in for nurse only suture removal; Rt wrist sutures intact no redness  drainage ,odor , temp noted ; Cleansed area with chorla prep ; removed 5 sutures; cleansed with cholra prep; applied steri strep adhesive; applied steri streps x 5 to incision site; patient tolerated well; advised patient to keep area clean and dry ; do not remove steri streps they will fall off in a few days , to call if temp, redness, serous drainage or odor. To return to see Dr. Biagio Quint on 02/01/13 @ 900 am   Patient verbalized understanding

## 2013-01-30 ENCOUNTER — Encounter (INDEPENDENT_AMBULATORY_CARE_PROVIDER_SITE_OTHER): Payer: Medicare Other | Admitting: General Surgery

## 2013-02-01 ENCOUNTER — Encounter (INDEPENDENT_AMBULATORY_CARE_PROVIDER_SITE_OTHER): Payer: Medicare Other | Admitting: General Surgery

## 2013-02-01 ENCOUNTER — Encounter (INDEPENDENT_AMBULATORY_CARE_PROVIDER_SITE_OTHER): Payer: Self-pay | Admitting: General Surgery

## 2013-02-01 ENCOUNTER — Ambulatory Visit (INDEPENDENT_AMBULATORY_CARE_PROVIDER_SITE_OTHER): Payer: Medicare Other | Admitting: General Surgery

## 2013-02-01 VITALS — BP 118/82 | HR 66 | Temp 97.5°F | Resp 16 | Ht 60.0 in | Wt 125.8 lb

## 2013-02-01 DIAGNOSIS — Z5189 Encounter for other specified aftercare: Secondary | ICD-10-CM

## 2013-02-01 DIAGNOSIS — Z4889 Encounter for other specified surgical aftercare: Secondary | ICD-10-CM

## 2013-02-01 NOTE — Progress Notes (Signed)
Subjective:     Patient ID: Yolanda Phillips, female   DOB: 1930/07/29, 79 y.o.   MRN: [id number]  HPI No issues and no complaints. Pathology benign. She has no pain.  Review of Systems     Objective:   Physical Exam She still has some retained suture material which I removed today. Do not see any residual suture material left after the final suture removal. The wound is healing very nicely and has very good cosmesis.there is no evidence of any recurrent melanoma    Assessment:     Status post wide local excision of right wrist melanoma  She is doing very well and there is no evidence of any postoperative complication. She has good cosmesis and her pathology is very favorable. The final sutures were removed and to gradually increase activity as tolerated. Recommended that she have annual complete head to toe skin examination with her dermatologist or primary care physician annually.     Plan:     Complete skin examination. The patient and annually by her physician or her dermatologist She can follow up with surgery PRN

## 2013-02-27 ENCOUNTER — Encounter (INDEPENDENT_AMBULATORY_CARE_PROVIDER_SITE_OTHER): Payer: Self-pay | Admitting: Surgery

## 2013-02-27 ENCOUNTER — Ambulatory Visit (INDEPENDENT_AMBULATORY_CARE_PROVIDER_SITE_OTHER): Payer: Medicare Other | Admitting: Surgery

## 2013-02-27 VITALS — BP 118/70 | HR 68 | Temp 98.0°F | Resp 18 | Ht 64.0 in | Wt 127.0 lb

## 2013-02-27 DIAGNOSIS — T561X1A Toxic effect of mercury and its compounds, accidental (unintentional), initial encounter: Secondary | ICD-10-CM

## 2013-02-27 NOTE — Patient Instructions (Signed)
Keep Band-Aid on until tomorrow  Wash wound twice daily with soap and water and cover with band aid until healed

## 2013-02-27 NOTE — Progress Notes (Signed)
Subjective:     Patient ID: Yolanda Phillips, female   DOB: [birth year], 79 y.o.   MRN: [id number]  HPI She is a patient of Dr. Delice Lesch.  She had a wide excision of a melanoma on 01/14/2013. She had suture removal after that but then had return first retained sutures. She now has developed an area of erythema at her incision.  Review of Systems     Objective:   Physical Exam On exam, there is an area of erythema. I prepped this area of alcohol, anesthetized with lidocaine, made an incision with a scalpel, and removed what appeared to be nylon suture material. I then covered the wound with a Band-Aid    Assessment:     Postoperative stitch abscess     Plan:     She will leave the Band-Aid on until tomorrow and then washed the wound with soap and water. We will see her back as needed

## 2013-03-07 ENCOUNTER — Telehealth (INDEPENDENT_AMBULATORY_CARE_PROVIDER_SITE_OTHER): Payer: Self-pay | Admitting: General Surgery

## 2013-03-07 NOTE — Telephone Encounter (Signed)
I called her to see how her wound was doing.  She says that it seems to be doing okay.  I told her to call me directly if she has any recurrent issues or redness with the wound so we could evaluate it.

## 2013-03-22 ENCOUNTER — Emergency Department (HOSPITAL_COMMUNITY)
Admission: EM | Admit: 2013-03-22 | Discharge: 2013-03-22 | Disposition: A | Discharge status: Home / Self Care | Payer: Medicare Other | Attending: Emergency Medicine | Admitting: Emergency Medicine

## 2013-03-22 ENCOUNTER — Emergency Department (HOSPITAL_COMMUNITY): Payer: Medicare Other

## 2013-03-22 ENCOUNTER — Encounter (HOSPITAL_COMMUNITY): Payer: Self-pay | Admitting: Emergency Medicine

## 2013-03-22 DIAGNOSIS — Z7982 Long term (current) use of aspirin: Secondary | ICD-10-CM | POA: Insufficient documentation

## 2013-03-22 DIAGNOSIS — Z79899 Other long term (current) drug therapy: Secondary | ICD-10-CM | POA: Insufficient documentation

## 2013-03-22 DIAGNOSIS — R071 Chest pain on breathing: Secondary | ICD-10-CM | POA: Insufficient documentation

## 2013-03-22 DIAGNOSIS — K219 Gastro-esophageal reflux disease without esophagitis: Secondary | ICD-10-CM | POA: Insufficient documentation

## 2013-03-22 DIAGNOSIS — R0789 Other chest pain: Secondary | ICD-10-CM

## 2013-03-22 HISTORY — DX: Chondrocostal junction syndrome (tietze): M94.0

## 2013-03-22 LAB — BASIC METABOLIC PANEL
CO2: 25 mEq/L (ref 19–32)
Chloride: 105 mEq/L (ref 96–112)
Creatinine, Ser: 0.63 mg/dL (ref 0.50–1.10)
GFR calc Af Amer: >90 mL/min (ref >90)
Potassium: 3.5 mEq/L (ref 3.5–5.1)

## 2013-03-22 LAB — CBC
MCV: 87.8 fL (ref 78.0–100.0)
Platelets: 274 10*3/uL (ref 150–400)
RBC: 4.76 MIL/uL (ref 3.87–5.11)
RDW: 12.8 % (ref 11.5–15.5)
WBC: 6.7 10*3/uL (ref 4.0–10.5)

## 2013-03-22 LAB — POCT I-STAT TROPONIN I: Troponin i, poc: 0 ng/mL (ref 0.00–0.08)

## 2013-03-22 NOTE — ED Notes (Signed)
Pt belongings in bag at bedside.

## 2013-03-22 NOTE — ED Notes (Signed)
Patient transported to X-ray 

## 2013-03-22 NOTE — ED Notes (Signed)
Pt brought back to room; pt getting undressed and into a gown at this time 

## 2013-03-22 NOTE — ED Provider Notes (Signed)
Medical screening examination/treatment/procedure(s) were conducted as a shared visit with non-physician practitioner(s) and myself.  I personally evaluated the patient during the encounter  The patient seen by me. Patient presents for chest pain that is been ongoing for several weeks. Troponin is negative EKG without any acute changes. Patient's chest x-ray also without evidence of pneumonia or pneumothorax or pulmonary edema. Patient's primary care doctor or refer her to cardiology she has followup with them next week encouraged her to keep that appointment. No evidence of any acute cardiac event here today patient can be discharged home.  Results for orders placed during the hospital encounter of 03/22/13  CBC      Result Value Range   WBC 6.7  4.0 - 10.5 K/uL   RBC 4.76  3.87 - 5.11 MIL/uL   Hemoglobin 14.3  12.0 - 15.0 g/dL   HCT 14.7  82.9 - 56.2 %   MCV 87.8  78.0 - 100.0 fL   MCH 30.0  26.0 - 34.0 pg   MCHC 34.2  30.0 - 36.0 g/dL   RDW 13.0  86.5 - 78.4 %   Platelets 274  150 - 400 K/uL  BASIC METABOLIC PANEL      Result Value Range   Sodium 143  135 - 145 mEq/L   Potassium 3.5  3.5 - 5.1 mEq/L   Chloride 105  96 - 112 mEq/L   CO2 25  19 - 32 mEq/L   Glucose, Bld 133 (*) 70 - 99 mg/dL   BUN 18  6 - 23 mg/dL   Creatinine, Ser 6.96  0.50 - 1.10 mg/dL   Calcium 9.1  8.4 - 29.5 mg/dL   GFR calc non Af Amer 82 (*) >90 mL/min   GFR calc Af Amer >90  >90 mL/min  POCT I-STAT TROPONIN I      Result Value Range   Troponin i, poc 0.00  0.00 - 0.08 ng/mL   Comment 3            Results for orders placed during the hospital encounter of 03/22/13  CBC      Result Value Range   WBC 6.7  4.0 - 10.5 K/uL   RBC 4.76  3.87 - 5.11 MIL/uL   Hemoglobin 14.3  12.0 - 15.0 g/dL   HCT 28.4  13.2 - 44.0 %   MCV 87.8  78.0 - 100.0 fL   MCH 30.0  26.0 - 34.0 pg   MCHC 34.2  30.0 - 36.0 g/dL   RDW 10.2  72.5 - 36.6 %   Platelets 274  150 - 400 K/uL  BASIC METABOLIC PANEL      Result Value  Range   Sodium 143  135 - 145 mEq/L   Potassium 3.5  3.5 - 5.1 mEq/L   Chloride 105  96 - 112 mEq/L   CO2 25  19 - 32 mEq/L   Glucose, Bld 133 (*) 70 - 99 mg/dL   BUN 18  6 - 23 mg/dL   Creatinine, Ser 4.40  0.50 - 1.10 mg/dL   Calcium 9.1  8.4 - 34.7 mg/dL   GFR calc non Af Amer 82 (*) >90 mL/min   GFR calc Af Amer >90  >90 mL/min  POCT I-STAT TROPONIN I      Result Value Range   Troponin i, poc 0.00  0.00 - 0.08 ng/mL   Comment 3            Dg Chest 2 View  03/22/2013  CLINICAL DATA:  Left-sided chest pain.  EXAM: CHEST  2 VIEW  COMPARISON:  01/09/2013.  FINDINGS: Cardiopericardial silhouette within normal limits. Tortuous thoracic aorta. Bilateral pleural apical scarring. Chronic mid thoracic compression fractures. There is no airspace disease. No pleural effusion. No pneumothorax.  IMPRESSION: No interval change or acute cardiopulmonary disease.   Electronically Signed   By: Andreas Newport M.D.   On: 03/22/2013 12:25      Shelda Jakes, MD 03/22/13 [phone]

## 2013-03-22 NOTE — ED Notes (Signed)
Pt reports an aching in her chest radiating into her back increasingly worse over past month. Pt reports history of costochondritis with similar pain. A&ox4, resp e/u

## 2013-03-22 NOTE — ED Provider Notes (Signed)
CSN: [id number]     Arrival date & time 03/22/13  1122 History   First MD Initiated Contact with Patient 03/22/13 1130     Chief Complaint  Patient presents with  . Chest Pain   (Consider location/radiation/quality/duration/timing/severity/associated sxs/prior Treatment) HPI Comments: Patient reports that she has had chest pain intermittently over the past month.  She reports that the pain has been constant over the past week.  Pain located left anterior chest and does not radiate.  She describes the pain as an "ache."  She reports that at times the pain becomes worse, but she has not noticed any worsening of the pain with exertion or with eating.  She denies SOB, nausea, vomiting, numbness, tingling, lightheadedness, or syncope.  Denies cough, fever, or chills.  She took one 324 mg dose of Aspirin this morning, but does not feel that it helped with the pain.  She saw her PCP four days ago for the pain and was referred to Cardiology.  She has an appointment scheduled with Cardiology next week.  She denies any prior cardiac history.  Denies history of HTN, Hyperlipidemia, or DM.  She does not smoke.  She denies prolonged travel or surgeries in the past 4 weeks.  Denies LE edema.  Denies any history of DVT or PE.  The history is provided by the patient.    Past Medical History  Diagnosis Date  . GERD (gastroesophageal reflux disease)   . Osteoporosis   . PONV (postoperative nausea and vomiting)   . Costochondritis    Past Surgical History  Procedure Laterality Date  . Cholecystectomy    . Abdominal hysterectomy    . Facial cosmetic surgery      20 + years ago  . Tonsillectomy    . Eye surgery      bilateral cataracts  . Melanoma excision Right 01/14/2013    Procedure: WIDE LOCAL EXCISION RIGHT WRIST MELANOMA ;  Surgeon: Lodema Pilot, DO;  Location: [facility] OR;  Service: General;  Laterality: Right;  . Breast surgery Bilateral     implants   History reviewed. No pertinent family  history. History  Substance Use Topics  . Smoking status: Never Smoker   . Smokeless tobacco: Never Used  . Alcohol Use: No   OB History   Grav Para Term Preterm Abortions TAB SAB Ect Mult Living                 Review of Systems  Cardiovascular: Positive for chest pain.  All other systems reviewed and are negative.    Allergies  Morphine and related  Home Medications   Current Outpatient Rx  Name  Route  Sig  Dispense  Refill  . aspirin EC 325 MG tablet   Oral   Take 325 mg by mouth daily.         . calcium-vitamin D (OSCAL WITH D) 500-200 MG-UNIT per tablet   Oral   Take 1 tablet by mouth 2 (two) times daily.         . COD LIVER OIL/VITAMINS A & D CAPS   Oral   Take by mouth. 415 mg cod liver oil +A and D         . Multiple Vitamin (MULTIVITAMIN WITH MINERALS) TABS   Oral   Take 1 tablet by mouth daily. One a Day Women's 50 plus         . pantoprazole (PROTONIX) 40 MG tablet   Oral   Take 40 mg by mouth  daily.          . Probiotic Product (ALIGN PO)   Oral   Take 1 capsule by mouth daily.          BP 149/71  Pulse 78  Resp 12  SpO2 100% Physical Exam  Nursing note and vitals reviewed. Constitutional: She appears well-developed and well-nourished.  HENT:  Head: Normocephalic and atraumatic.  Mouth/Throat: Oropharynx is clear and moist.  Neck: Normal range of motion. Neck supple.  Cardiovascular: Normal rate, regular rhythm and normal heart sounds.   Chest tender to palpation  Pulmonary/Chest: Effort normal and breath sounds normal. No respiratory distress. She has no wheezes. She has no rales. She exhibits tenderness.  Musculoskeletal: Normal range of motion.  Neurological: She is alert.  Skin: Skin is warm and dry.    ED Course  Procedures (including critical care time) Labs Review Labs Reviewed  CBC  BASIC METABOLIC PANEL  POCT I-STAT TROPONIN I   Imaging Review Dg Chest 2 View  03/22/2013   CLINICAL DATA:  Left-sided  chest pain.  EXAM: CHEST  2 VIEW  COMPARISON:  01/09/2013.  FINDINGS: Cardiopericardial silhouette within normal limits. Tortuous thoracic aorta. Bilateral pleural apical scarring. Chronic mid thoracic compression fractures. There is no airspace disease. No pleural effusion. No pneumothorax.  IMPRESSION: No interval change or acute cardiopulmonary disease.   Electronically Signed   By: Andreas Newport M.D.   On: 03/22/2013 12:25    EKG Interpretation   None       Date: 03/22/2013  Rate: 83  Rhythm: normal sinus rhythm  QRS Axis: normal  Intervals: normal  ST/T Wave abnormalities: nonspecific T wave changes  Conduction Disutrbances:none  Narrative Interpretation:   Old EKG Reviewed: none available    MDM  No diagnosis found. Patient presenting with chest pain.  Pain has been present for the past month, but constant over the past week.  No prior cardiac history.  Chest wall tender to palpation.  No ischemic changes on EKG.  Troponin negative.  CXR negative.  Patient has an appointment with Cardiology scheduled next week.  Patient instructed to keep her appointment and follow up.  Patient stable for discharge.  Return precautions given.    Pascal Lux [place], PA-C 03/22/13 1422

## 2013-03-26 ENCOUNTER — Encounter: Payer: Self-pay | Admitting: Cardiology

## 2013-03-26 DIAGNOSIS — K219 Gastro-esophageal reflux disease without esophagitis: Secondary | ICD-10-CM | POA: Insufficient documentation

## 2013-03-26 DIAGNOSIS — R079 Chest pain, unspecified: Secondary | ICD-10-CM | POA: Insufficient documentation

## 2013-03-26 DIAGNOSIS — K5790 Diverticulosis of intestine, part unspecified, without perforation or abscess without bleeding: Secondary | ICD-10-CM | POA: Insufficient documentation

## 2013-03-26 DIAGNOSIS — K589 Irritable bowel syndrome without diarrhea: Secondary | ICD-10-CM | POA: Insufficient documentation

## 2013-03-26 DIAGNOSIS — M810 Age-related osteoporosis without current pathological fracture: Secondary | ICD-10-CM | POA: Insufficient documentation

## 2013-03-26 DIAGNOSIS — M940 Chondrocostal junction syndrome [Tietze]: Secondary | ICD-10-CM | POA: Insufficient documentation

## 2013-03-26 DIAGNOSIS — R112 Nausea with vomiting, unspecified: Secondary | ICD-10-CM | POA: Insufficient documentation

## 2013-03-29 ENCOUNTER — Ambulatory Visit (INDEPENDENT_AMBULATORY_CARE_PROVIDER_SITE_OTHER): Payer: Medicare Other | Admitting: Cardiology

## 2013-03-29 ENCOUNTER — Encounter: Payer: Self-pay | Admitting: Cardiology

## 2013-03-29 VITALS — BP 134/80 | HR 93 | Ht 64.0 in | Wt 127.0 lb

## 2013-03-29 DIAGNOSIS — R0789 Other chest pain: Secondary | ICD-10-CM | POA: Insufficient documentation

## 2013-03-29 DIAGNOSIS — K219 Gastro-esophageal reflux disease without esophagitis: Secondary | ICD-10-CM

## 2013-03-29 NOTE — Progress Notes (Signed)
[address] [place], [place]  [address] Phone: [phone] Fax:  [phone]  Date:  03/29/2013   ID:  Yolanda Phillips, DOB [birth year], MRN [id number]  PCP:  Thora Lance, MD   History of Present Illness: Yolanda Phillips is a 79 y.o. female here for evaluation of chest pain/shoulder pain. Seems to be left-sided, most often worse when laying down and feel some tenderness in the region when the pain is present. Left lower/left parasternal region noted. Approximately a month ago she also had some nausea, diaphoresis but thought that this was a side effect to hydrocodone that she was given postoperatively after surgery. The pain in her chest does not appear to be exertional, she has been able to go to aerobics class and does not have a problem with her discomfort. She does not seem to have any associated dyspnea or she also sometimes has some pain in her upper back but he is unclear if this occurs at the same time as chest pain. She is concerned that she may have had a heart attack and maybe does not know about it. She has had similar discomfort in the past. Symptoms just getting worse.   Has been taking care of terminally ill friend.   She has a history of IBS, hyperlipidemia, diverticulosis. She is a nonsmoker. Never has. An EKG performed on 03/18/13 showed possible anteroseptal MI age undetermined. No acute ST segment changes. Personally reviewed. No family history of CAD.   Wt Readings from Last 3 Encounters:  03/29/13 127 lb (57.607 kg)  02/27/13 127 lb (57.607 kg)  02/01/13 125 lb 12.8 oz (57.063 kg)     Past Medical History  Diagnosis Date  . GERD (gastroesophageal reflux disease)   . Osteoporosis   . PONV (postoperative nausea and vomiting)   . Costochondritis   . Diverticulosis   . IBS (irritable bowel syndrome)     Past Surgical History  Procedure Laterality Date  . Cholecystectomy    . Abdominal hysterectomy    . Facial cosmetic surgery      20 +  years ago  . Tonsillectomy    . Eye surgery      bilateral cataracts  . Melanoma excision Right 01/14/2013    Procedure: WIDE LOCAL EXCISION RIGHT WRIST MELANOMA ;  Surgeon: Lodema Pilot, DO;  Location: [facility] OR;  Service: General;  Laterality: Right;  . Breast surgery Bilateral     implants    Current Outpatient Prescriptions  Medication Sig Dispense Refill  . aspirin EC 325 MG tablet Take 325 mg by mouth daily.      . calcium-vitamin D (OSCAL WITH D) 500-200 MG-UNIT per tablet Take 1 tablet by mouth 2 (two) times daily.      . COD LIVER OIL/VITAMINS A & D CAPS Take by mouth. 415 mg cod liver oil +A and D      . Multiple Vitamin (MULTIVITAMIN WITH MINERALS) TABS Take 1 tablet by mouth daily. One a Day Women's 50 plus      . pantoprazole (PROTONIX) 40 MG tablet Take 40 mg by mouth daily.       . Probiotic Product (ALIGN PO) Take 1 capsule by mouth daily.      . zoledronic acid (RECLAST) 5 MG/100ML SOLN injection Inject 5 mg into the vein. Yearly       No current facility-administered medications for this visit.    Allergies:    Allergies  Allergen Reactions  .  Fosamax [Alendronate Sodium] Other (See Comments)    GERDS  . Boniva [Ibandronic Acid] Diarrhea  . Evista [Raloxifene] Diarrhea  . Levbid [Hyoscyamine] Other (See Comments)    Cramps  . Nexium [Esomeprazole Magnesium] Diarrhea  . Prevacid [Lansoprazole] Diarrhea  . Morphine And Related Other (See Comments)    Made patient very dizzy    Social History:  The patient  reports that she has never smoked. She has never used smokeless tobacco. She reports that she does not drink alcohol or use illicit drugs.   Family History  Problem Relation Age of Onset  . Multiple myeloma Mother   . Hypertension Father     ROS:  Please see the history of present illness.   No bleeding, no syncope, no orthopnea. Prior breast implants noted. No strokelike symptoms. No dysphagia.   All other systems reviewed and negative.   PHYSICAL  EXAM: VS:  BP 134/80  Pulse 93  Ht 5\' 4"  (1.626 m)  Wt 127 lb (57.607 kg)  BMI 21.79 kg/m2  SpO2 96% Well nourished, well developed, in no acute distress HEENT: normal, [place]/AT, EOMI Neck: no JVD, normal carotid upstroke, no bruit Cardiac:  normal S1, S2; RRR; no murmur Lungs:  clear to auscultation bilaterally, no wheezing, rhonchi or rales Abd: soft, nontender, no hepatomegaly, no bruits Ext: no edema, 2+ distal pulses Skin: warm and dry GU: deferred Neuro: no focal abnormalities noted, AAO x 3  EKG:  As described above.     ASSESSMENT AND PLAN:  1. Atypical chest pain-likely musculoskeletal based upon description however I have cautioned her that based upon her EKG with possible old prior septal infarct pattern but a stress test may be helpful to further delineate for diagnosed possible coronary artery disease. At this time, she would rather continue to monitor her symptoms. Given the atypical nature and likely musculoskeletal nature of this, this is not unreasonable however I did want her that heart disease/CAD is #1. I will see her back soon in 3 months and she will call me if her symptoms worsen or become more worrisome. I've asked her to perform some stretching exercises. If her symptoms become more worrisome, I will likely proceed with stress test. She is fine with this plan and she will let me know if anything changes. Continue with aspirin. 81 mg is reasonable. The episode of nausea certainly could of been related to hydrocodone. She's not had any episodes since. If diaphoresis or worrisome chest discomfort develops once again she knows to contact me. 2. GERD-on Protonix 3. Hyperlipidemia-dietary modification. 4. Three-month followup  Signed, Donato Schultz, MD [facility]  03/29/2013 11:06 AM

## 2013-03-29 NOTE — Patient Instructions (Signed)
Your physician recommends that you continue on your current medications as directed. Please refer to the Current Medication list given to you today.  Your physician recommends that you schedule a follow-up appointment in: 3 months with Dr. Anne Fu  * If chest pains continues please call for earlier appointment.

## 2013-05-27 ENCOUNTER — Other Ambulatory Visit (HOSPITAL_COMMUNITY): Payer: Self-pay | Admitting: *Deleted

## 2013-05-28 ENCOUNTER — Ambulatory Visit (HOSPITAL_COMMUNITY)
Admission: RE | Admit: 2013-05-28 | Discharge: 2013-05-28 | Disposition: A | Discharge status: Home / Self Care | Payer: Medicare Other | Source: Ambulatory Visit | Attending: Family Medicine | Admitting: Family Medicine

## 2013-05-28 DIAGNOSIS — M810 Age-related osteoporosis without current pathological fracture: Secondary | ICD-10-CM | POA: Insufficient documentation

## 2013-05-28 MED ORDER — ZOLEDRONIC ACID 5 MG/100ML IV SOLN
5.0000 mg | Freq: Once | INTRAVENOUS | Status: AC
  Administered 2013-05-28: 5 mg via INTRAVENOUS

## 2013-05-28 MED ORDER — ZOLEDRONIC ACID 5 MG/100ML IV SOLN
INTRAVENOUS | Status: AC
Start: 2013-05-28 — End: 2013-05-28
  Filled 2013-05-28: qty 100

## 2013-07-07 ENCOUNTER — Encounter: Payer: Self-pay | Admitting: Cardiology

## 2013-07-12 ENCOUNTER — Ambulatory Visit (INDEPENDENT_AMBULATORY_CARE_PROVIDER_SITE_OTHER): Payer: Medicare Other | Admitting: Cardiology

## 2013-07-12 ENCOUNTER — Encounter: Payer: Self-pay | Admitting: Cardiology

## 2013-07-12 VITALS — BP 140/80 | HR 76 | Ht 64.0 in | Wt 127.0 lb

## 2013-07-12 DIAGNOSIS — I10 Essential (primary) hypertension: Secondary | ICD-10-CM

## 2013-07-12 DIAGNOSIS — R079 Chest pain, unspecified: Secondary | ICD-10-CM

## 2013-07-12 DIAGNOSIS — E785 Hyperlipidemia, unspecified: Secondary | ICD-10-CM

## 2013-07-12 NOTE — Progress Notes (Signed)
[place]. [address], [place]  [address] Phone: [phone] Fax:  [phone]  Date:  07/12/2013   ID:  Yolanda Phillips, DOB Dec 11, 1930, MRN [id number]  PCP:  Simona Huh, MD   History of Present Illness: Yolanda Phillips is a 80 y.o. female here for followup of chest pain/shoulder pain. Seems to be left-sided, most often worse when laying down and feel some tenderness in the region when the pain is present. Left lower/left parasternal region noted. Approximately a month ago she also had some nausea, diaphoresis but thought that this was a side effect to hydrocodone that she was given postoperatively after surgery. The pain in her chest does not appear to be exertional, she has been able to go to aerobics class and does not have a problem with her discomfort. She does not seem to have any associated dyspnea or she also sometimes has some pain in her upper back but he is unclear if this occurs at the same time as chest pain. She is concerned that she may have had a heart attack and maybe does not know about it. She has had similar discomfort in the past. Symptoms just getting worse.   Has been taking care of terminally ill friend.   She has a history of IBS, hyperlipidemia, diverticulosis. She is a nonsmoker. Never has. An EKG performed on 03/18/13 showed possible anteroseptal MI age undetermined. No acute ST segment changes. Personally reviewed. No family history of CAD.  07/12/13 - She continues to have chest discomfort, pressure. She is under a lot of stress she states.     Wt Readings from Last 3 Encounters:  07/12/13 127 lb (57.607 kg)  03/29/13 127 lb (57.607 kg)  02/27/13 127 lb (57.607 kg)     Past Medical History  Diagnosis Date  . GERD (gastroesophageal reflux disease)   . Osteoporosis   . PONV (postoperative nausea and vomiting)   . Costochondritis   . Diverticulosis   . IBS (irritable bowel syndrome)   . Hypercholesterolemia   . Diverticulosis      Past Surgical History  Procedure Laterality Date  . Cholecystectomy    . Abdominal hysterectomy    . Facial cosmetic surgery      20 + years ago  . Tonsillectomy    . Eye surgery      bilateral cataracts  . Melanoma excision Right 01/14/2013    Procedure: WIDE LOCAL EXCISION RIGHT WRIST MELANOMA ;  Surgeon: Madilyn Hook, DO;  Location: [facility];  Service: General;  Laterality: Right;  . Breast surgery Bilateral     implants    Current Outpatient Prescriptions  Medication Sig Dispense Refill  . calcium-vitamin D (OSCAL WITH D) 500-200 MG-UNIT per tablet Take 1 tablet by mouth 2 (two) times daily.      . COD LIVER OIL/VITAMINS A & D CAPS Take by mouth. 415 mg cod liver oil +A and D      . Multiple Vitamin (MULTIVITAMIN WITH MINERALS) TABS Take 1 tablet by mouth daily. One a Day Women's 50 plus      . pantoprazole (PROTONIX) 40 MG tablet Take 40 mg by mouth as needed.       . Probiotic Product (ALIGN PO) Take 1 capsule by mouth as needed.       . zoledronic acid (RECLAST) 5 MG/100ML SOLN injection Inject 5 mg into the vein. Yearly       No current facility-administered medications for this  visit.    Allergies:    Allergies  Allergen Reactions  . Fosamax [Alendronate Sodium] Other (See Comments)    GERDS  . Boniva [Ibandronic Acid] Diarrhea  . Evista [Raloxifene] Diarrhea  . Levbid [Hyoscyamine] Other (See Comments)    Cramps  . Nexium [Esomeprazole Magnesium] Diarrhea  . Prevacid [Lansoprazole] Diarrhea  . Morphine And Related Other (See Comments)    Made patient very dizzy    Social History:  The patient  reports that she has never smoked. She has never used smokeless tobacco. She reports that she does not drink alcohol or use illicit drugs.   Family History  Problem Relation Age of Onset  . Multiple myeloma Mother   . Hypertension Father     ROS:  Please see the history of present illness.   No bleeding, no syncope, no orthopnea. Prior breast implants noted. No  strokelike symptoms. No dysphagia.   All other systems reviewed and negative.   PHYSICAL EXAM: VS:  BP 140/80  Pulse 76  Ht 5' 4"  (1.626 m)  Wt 127 lb (57.607 kg)  BMI 21.79 kg/m2 Thin in no acute distress HEENT: normal, [place]/AT, EOMI Neck: no JVD, normal carotid upstroke, no bruit Cardiac:  normal S1, S2; RRR; no murmur Lungs:  clear to auscultation bilaterally, no wheezing, rhonchi or rales Abd: soft, nontender, no hepatomegaly, no bruits Ext: no edema, 2+ distal pulses Skin: warm and dry GU: deferred Neuro: no focal abnormalities noted, AAO x 3  EKG:  As described above.     ASSESSMENT AND PLAN:  1. Chest pain-continues to have pressure. Under stress taking care of boyfiriend. Possibly musculoskeletal based upon description however I have cautioned her that based upon her EKG with possible old prior septal infarct pattern but a nuclear stress test may be helpful to further delineate for diagnosed possible coronary artery disease. At this time, she is willing to proceed with stress test.  Continue with aspirin. 81 mg is reasonable. The episode of nausea certainly could of been related to hydrocodone. She's not had any episodes since. GERD-on Protonix 2. Hyperlipidemia-dietary modification. 3. Six-month followup  Signed, Candee Furbish, MD [facility]  07/12/2013 3:54 PM

## 2013-07-12 NOTE — Patient Instructions (Signed)
Your physician recommends that you continue on your current medications as directed. Please refer to the Current Medication list given to you today.  Your physician has requested that you have en exercise stress myoview. For further information please visit [email or web address]. Please follow instruction sheet, as given.  Your physician wants you to follow-up in: 6 months with D. Skains. You will receive a reminder letter in the mail two months in advance. If you don't receive a letter, please call our office to schedule the follow-up appointment.

## 2013-07-25 ENCOUNTER — Ambulatory Visit (HOSPITAL_COMMUNITY): Payer: Medicare Other | Attending: Cardiology | Admitting: Radiology

## 2013-07-25 VITALS — BP 196/97 | HR 75 | Ht 60.0 in | Wt 125.0 lb

## 2013-07-25 DIAGNOSIS — E785 Hyperlipidemia, unspecified: Secondary | ICD-10-CM | POA: Insufficient documentation

## 2013-07-25 DIAGNOSIS — R9431 Abnormal electrocardiogram [ECG] [EKG]: Secondary | ICD-10-CM | POA: Insufficient documentation

## 2013-07-25 DIAGNOSIS — R079 Chest pain, unspecified: Secondary | ICD-10-CM

## 2013-07-25 DIAGNOSIS — R0789 Other chest pain: Secondary | ICD-10-CM | POA: Insufficient documentation

## 2013-07-25 MED ORDER — TECHNETIUM TC 99M SESTAMIBI GENERIC - CARDIOLITE
11.0000 | Freq: Once | INTRAVENOUS | Status: AC | PRN
  Administered 2013-07-25: 11 via INTRAVENOUS

## 2013-07-25 MED ORDER — TECHNETIUM TC 99M SESTAMIBI GENERIC - CARDIOLITE
33.0000 | Freq: Once | INTRAVENOUS | Status: AC | PRN
  Administered 2013-07-25: 33 via INTRAVENOUS

## 2013-07-25 NOTE — Progress Notes (Signed)
  [facility] [address] [place], [place] [address] [phone]    Cardiology Nuclear Med Study  Yolanda Phillips is a 80 y.o. female     MRN : [id number]     DOB: 1930-07-22  Procedure Date: 07/25/2013  Nuclear Med Background Indication for Stress Test:  Evaluation for Ischemia and Abnormal EKG History:  No known CAD Cardiac Risk Factors: Lipids  Symptoms:  Chest Pain (last date of chest discomfort was one week ago)   Nuclear Pre-Procedure Caffeine/Decaff Intake:  8:00pm NPO After: 8:00pm   Lungs:  clear O2 Sat: 93% on room air. IV 0.9% NS with Angio Cath:  22g  IV Site: R Hand  IV Started by:  Matilde Haymaker, RN  Chest Size (in):  34 Cup Size: C  Height: 5' (1.524 m)  Weight:  125 lb (56.7 kg)  BMI:  Body mass index is 24.41 kg/(m^2). Tech Comments:  n/a    Nuclear Med Study 1 or 2 day study: 1 day  Stress Test Type:  Stress  Reading MD: n/a  Order Authorizing Provider:  Wallene Huh  Resting Radionuclide: Technetium 91m Sestamibi  Resting Radionuclide Dose: 11.0 mCi   Stress Radionuclide:  Technetium 24m Sestamibi  Stress Radionuclide Dose: 33.0 mCi           Stress Protocol Rest HR: 75 Stress HR: 139  Rest BP: 196/97 Stress BP: 236/113  Exercise Time (min): 4:00 METS: 4.6           Dose of Adenosine (mg):  n/a Dose of Lexiscan: n/a   Dose of Atropine (mg): n/a Dose of Dobutamine: n/a mcg/kg/min (at max HR)  Stress Test Technologist: Glade Lloyd, BS-ES  Nuclear Technologist:  Charlton Amor, CNMT     Rest Procedure:  Myocardial perfusion imaging was performed at rest 45 minutes following the intravenous administration of Technetium 63m Sestamibi. Rest ECG: NSR with non-specific ST-T wave changes  Stress Procedure:  The patient exercised on the treadmill utilizing the Bruce Protocol for 4:00 minutes. The patient stopped due to fatigue and denied any chest pain.  Technetium 86m Sestamibi was injected at peak exercise and  myocardial perfusion imaging was performed after a brief delay. Stress ECG: There was 52mm of horizontal to upsloping ST segment depression in the inferolateral leads with <4mm of horizontal ST segement depression in the inferior leads in recovery  QPS Raw Data Images:  Mild breast attenuation.  Normal left ventricular size. Stress Images:  Normal homogeneous uptake in all areas of the myocardium. Rest Images:  Normal homogeneous uptake in all areas of the myocardium. Subtraction (SDS):  No evidence of ischemia. Transient Ischemic Dilatation (Normal <1.22):  0.70 Lung/Heart Ratio (Normal <0.45):  0.28  Quantitative Gated Spect Images QGS EDV:  48 ml QGS ESV:  12 ml  Impression Exercise Capacity:  Fair exercise capacity. BP Response:  Hypertensive blood pressure response. Clinical Symptoms:  Mild chest pain/dyspnea. ECG Impression:  Nondiagnostic EKG due to baseline ST/T wave abnormality Comparison with Prior Nuclear Study: No previous nuclear study performed  Overall Impression:  Normal stress nuclear study.  LV Ejection Fraction: 76%.  LV Wall Motion:  NL LV Function; NL Wall Motion   .Signed: Fransico Him, MD 07/25/2013

## 2013-07-31 ENCOUNTER — Telehealth: Payer: Self-pay | Admitting: Cardiology

## 2013-07-31 NOTE — Telephone Encounter (Signed)
New message ° ° ° ° °Want stress test results °

## 2013-07-31 NOTE — Telephone Encounter (Signed)
Contacted patient , patient aware of results.

## 2013-09-18 ENCOUNTER — Other Ambulatory Visit: Payer: Self-pay | Admitting: Family Medicine

## 2013-09-18 DIAGNOSIS — R1031 Right lower quadrant pain: Secondary | ICD-10-CM

## 2013-09-19 ENCOUNTER — Ambulatory Visit
Admission: RE | Admit: 2013-09-19 | Discharge: 2013-09-19 | Disposition: A | Discharge status: Home / Self Care | Payer: Medicare Other | Source: Ambulatory Visit | Attending: Family Medicine | Admitting: Family Medicine

## 2013-09-19 DIAGNOSIS — R1031 Right lower quadrant pain: Secondary | ICD-10-CM

## 2013-09-19 MED ORDER — IOHEXOL 300 MG/ML  SOLN
100.0000 mL | Freq: Once | INTRAMUSCULAR | Status: AC | PRN
  Administered 2013-09-19: 100 mL via INTRAVENOUS

## 2014-06-09 ENCOUNTER — Other Ambulatory Visit (HOSPITAL_COMMUNITY): Payer: Self-pay | Admitting: *Deleted

## 2014-06-10 ENCOUNTER — Encounter (HOSPITAL_COMMUNITY)
Admission: RE | Admit: 2014-06-10 | Discharge: 2014-06-10 | Disposition: A | Discharge status: Home / Self Care | Payer: Medicare Other | Source: Ambulatory Visit | Attending: Family Medicine | Admitting: Family Medicine

## 2014-06-10 DIAGNOSIS — M810 Age-related osteoporosis without current pathological fracture: Secondary | ICD-10-CM | POA: Diagnosis present

## 2014-06-10 MED ORDER — ZOLEDRONIC ACID 5 MG/100ML IV SOLN
INTRAVENOUS | Status: AC
  Administered 2014-06-10: 5 mg via INTRAVENOUS
  Filled 2014-06-10: qty 100

## 2014-06-10 MED ORDER — ZOLEDRONIC ACID 5 MG/100ML IV SOLN
5.0000 mg | Freq: Once | INTRAVENOUS | Status: AC
  Administered 2014-06-10: 5 mg via INTRAVENOUS

## 2015-05-17 ENCOUNTER — Encounter (HOSPITAL_COMMUNITY): Payer: Self-pay | Admitting: Emergency Medicine

## 2015-05-17 ENCOUNTER — Emergency Department (HOSPITAL_COMMUNITY): Payer: Medicare Other

## 2015-05-17 ENCOUNTER — Emergency Department (HOSPITAL_COMMUNITY)
Admission: EM | Admit: 2015-05-17 | Discharge: 2015-05-17 | Disposition: A | Discharge status: Home / Self Care | Payer: Medicare Other | Attending: Emergency Medicine | Admitting: Emergency Medicine

## 2015-05-17 DIAGNOSIS — S0181XA Laceration without foreign body of other part of head, initial encounter: Secondary | ICD-10-CM | POA: Diagnosis not present

## 2015-05-17 DIAGNOSIS — Z79899 Other long term (current) drug therapy: Secondary | ICD-10-CM | POA: Insufficient documentation

## 2015-05-17 DIAGNOSIS — W01198A Fall on same level from slipping, tripping and stumbling with subsequent striking against other object, initial encounter: Secondary | ICD-10-CM | POA: Diagnosis not present

## 2015-05-17 DIAGNOSIS — M810 Age-related osteoporosis without current pathological fracture: Secondary | ICD-10-CM | POA: Diagnosis not present

## 2015-05-17 DIAGNOSIS — Y9389 Activity, other specified: Secondary | ICD-10-CM | POA: Insufficient documentation

## 2015-05-17 DIAGNOSIS — Z8639 Personal history of other endocrine, nutritional and metabolic disease: Secondary | ICD-10-CM | POA: Insufficient documentation

## 2015-05-17 DIAGNOSIS — Y998 Other external cause status: Secondary | ICD-10-CM | POA: Diagnosis not present

## 2015-05-17 DIAGNOSIS — S0990XA Unspecified injury of head, initial encounter: Secondary | ICD-10-CM | POA: Diagnosis present

## 2015-05-17 DIAGNOSIS — M546 Pain in thoracic spine: Secondary | ICD-10-CM

## 2015-05-17 DIAGNOSIS — K219 Gastro-esophageal reflux disease without esophagitis: Secondary | ICD-10-CM | POA: Diagnosis not present

## 2015-05-17 DIAGNOSIS — S29002A Unspecified injury of muscle and tendon of back wall of thorax, initial encounter: Secondary | ICD-10-CM | POA: Diagnosis not present

## 2015-05-17 DIAGNOSIS — Y9289 Other specified places as the place of occurrence of the external cause: Secondary | ICD-10-CM | POA: Diagnosis not present

## 2015-05-17 DIAGNOSIS — S0191XA Laceration without foreign body of unspecified part of head, initial encounter: Secondary | ICD-10-CM

## 2015-05-17 DIAGNOSIS — W19XXXA Unspecified fall, initial encounter: Secondary | ICD-10-CM

## 2015-05-17 MED ORDER — FENTANYL CITRATE (PF) 100 MCG/2ML IJ SOLN
25.0000 ug | Freq: Once | INTRAMUSCULAR | Status: AC
  Administered 2015-05-17: 25 ug via INTRAVENOUS
  Filled 2015-05-17: qty 2

## 2015-05-17 MED ORDER — METHOCARBAMOL 500 MG PO TABS
750.0000 mg | ORAL_TABLET | Freq: Once | ORAL | Status: AC
  Administered 2015-05-17: 750 mg via ORAL
  Filled 2015-05-17: qty 2

## 2015-05-17 MED ORDER — METHOCARBAMOL 500 MG PO TABS: 500.0000 mg | ORAL_TABLET | Freq: Two times a day (BID) | ORAL | Status: DC

## 2015-05-17 MED ORDER — BACITRACIN ZINC 500 UNIT/GM EX OINT
TOPICAL_OINTMENT | Freq: Every day | CUTANEOUS | Status: DC
  Administered 2015-05-17: 1 via TOPICAL
  Filled 2015-05-17: qty 0.9

## 2015-05-17 MED ORDER — HYDROCODONE-ACETAMINOPHEN 5-325 MG PO TABS: 1.0000 | ORAL_TABLET | Freq: Four times a day (QID) | ORAL | Status: DC | PRN

## 2015-05-17 MED ORDER — HYDROCODONE-ACETAMINOPHEN 5-325 MG PO TABS
1.0000 | ORAL_TABLET | Freq: Once | ORAL | Status: DC
  Filled 2015-05-17: qty 1

## 2015-05-17 NOTE — ED Notes (Signed)
Pt from home c/o falling and hitting back of head. Laceration to posterior head. Bleeding controlled. Does not take blood thinners. She reports she had a cramp  In her leg caused her to fall. She c/o mid back pain.

## 2015-05-17 NOTE — ED Provider Notes (Signed)
CSN: [id number]     Arrival date & time 05/17/15  0557 History   First MD Initiated Contact with Patient 05/17/15 [phone]     Chief Complaint  Patient presents with  . Fall  . Head Injury   HPI   [name] is an 81 y.o. female with history of GERD who presents to the ED for evaluation of head injury after a fall. She states she was getting out of bed when her legs cramped up and she tripped and fell backwards, hitting her head on the nightstand/dresser. Denies LOC. She has a laceration to the crown of her head with well-controlled bleeding. She states she is not on any blood thinners and is up to date on her tetanus shot. She now reports she has some thoracic back pain that feels like a muscle spasm. Denies headache or blurred vision. Denies nausea. Denies chest pain or SOB.  Past Medical History  Diagnosis Date  . GERD (gastroesophageal reflux disease)   . Osteoporosis   . PONV (postoperative nausea and vomiting)   . Costochondritis   . Diverticulosis   . IBS (irritable bowel syndrome)   . Hypercholesterolemia   . Diverticulosis    Past Surgical History  Procedure Laterality Date  . Cholecystectomy    . Abdominal hysterectomy    . Facial cosmetic surgery      20 + years ago  . Tonsillectomy    . Eye surgery      bilateral cataracts  . Melanoma excision Right 01/14/2013    Procedure: WIDE LOCAL EXCISION RIGHT WRIST MELANOMA ;  Surgeon: Madilyn Hook, DO;  Location: [facility];  Service: General;  Laterality: Right;  . Breast surgery Bilateral     implants   Family History  Problem Relation Age of Onset  . Multiple myeloma Mother   . Hypertension Father    Social History  Substance Use Topics  . Smoking status: Never Smoker   . Smokeless tobacco: Never Used  . Alcohol Use: No   OB History    No data available     Review of Systems  All other systems reviewed and are negative.     Allergies  Fosamax; Boniva; Evista; Levbid; Nexium; Prevacid; and Morphine and  related  Home Medications   Prior to Admission medications   Medication Sig Start Date End Date Taking? Authorizing Provider  calcium-vitamin D (OSCAL WITH D) 500-200 MG-UNIT per tablet Take 1 tablet by mouth 2 (two) times daily.   Yes Historical Provider, MD  COD LIVER OIL/VITAMINS A & D CAPS Take by mouth. 415 mg cod liver oil +A and D   Yes Historical Provider, MD  Multiple Vitamin (MULTIVITAMIN WITH MINERALS) TABS Take 1 tablet by mouth daily. One a Day Women's 50 plus   Yes Historical Provider, MD  pantoprazole (PROTONIX) 40 MG tablet Take 40 mg by mouth as needed.  12/26/12  Yes Historical Provider, MD  zoledronic acid (RECLAST) 5 MG/100ML SOLN injection Inject 5 mg into the vein. Yearly    Historical Provider, MD   BP 144/63 mmHg  Pulse 72  Temp(Src) 97.4 F (36.3 C) (Oral)  Resp 18  SpO2 91% Physical Exam  Constitutional: She is oriented to person, place, and time. No distress.  HENT:  Head:    Right Ear: External ear normal.  Left Ear: External ear normal.  Nose: Nose normal.  Mouth/Throat: Oropharynx is clear and moist. No oropharyngeal exudate.  Eyes: Conjunctivae and EOM are normal. Pupils are equal, round, and  reactive to light.  Neck: Normal range of motion. Neck supple. No tracheal deviation present.  Cardiovascular: Normal rate, regular rhythm, normal heart sounds and intact distal pulses.   No murmur heard. Pulmonary/Chest: Effort normal and breath sounds normal. No respiratory distress. She has no wheezes.  Abdominal: Soft. Bowel sounds are normal. She exhibits no distension. There is no tenderness. There is no rebound and no guarding.  Musculoskeletal: She exhibits no edema.  Diffuse thoracic paraspinal tenderness  Lymphadenopathy:    She has no cervical adenopathy.  Neurological: She is alert and oriented to person, place, and time. No cranial nerve deficit.  Skin: Skin is warm and dry. She is not diaphoretic.  Psychiatric: She has a normal mood and affect.   Nursing note and vitals reviewed.   ED Course  Procedures (including critical care time) LACERATION REPAIR Performed by: Delrae Rend Authorized by: Delrae Rend Consent: Verbal consent obtained. Risks and benefits: risks, benefits and alternatives were discussed Consent given by: patient Patient identity confirmed: provided demographic data Prepped and Draped in normal sterile fashion Wound explored  Laceration Location: crown of head  Laceration Length: 2cm  No Foreign Bodies seen or palpated  Anesthesia: none  Irrigation method: syringe Amount of cleaning: standard  Skin closure: staples  Number of sutures: 2  Patient tolerance: Patient tolerated the procedure well with no immediate complications.   Labs Review Labs Reviewed - No data to display  Imaging Review Dg Thoracic Spine 2 View  05/17/2015  CLINICAL DATA:  Fall, laceration to posterior head, mid back pain. EXAM: THORACIC SPINE 2 VIEWS COMPARISON:  PA and lateral chest x-ray dated 03/22/2013. FINDINGS: Wedge compression deformities are seen within the upper and mid thoracic spine, slightly worsened compared to the previous study of 2014, chronic in appearance. There is associated thoracic spine kyphosis and dextroscoliosis which is similar to the previous exams. No acute - appearing fracture or dislocation seen. Paravertebral soft tissues are unremarkable. IMPRESSION: Multiple wedge compression fracture deformities within the upper and mid thoracic spine which appear chronic but have slightly worsened compared to the previous study of 2014. Associated kyphosis and dextroscoliosis of the thoracic spine, similar to the previous study. No acute-appearing fracture or dislocation. Electronically Signed   By: Franki Cabot M.D.   On: 05/17/2015 07:54   Ct Head Wo Contrast  05/17/2015  CLINICAL DATA:  Fall, hit back of head, laceration to posterior head. EXAM: CT HEAD WITHOUT CONTRAST TECHNIQUE: Contiguous axial images were  obtained from the base of the skull through the vertex without intravenous contrast. COMPARISON:  None. FINDINGS: There is mild generalized brain atrophy with commensurate dilatation of the ventricles and sulci. Chronic small vessel ischemic changes noted within the bilateral periventricular and subcortical white matter. There are chronic calcified atherosclerotic changes of the large vessels at the skull base. There is no mass, hemorrhage, edema or other evidence of acute parenchymal abnormality. No extra-axial hemorrhage. No osseous abnormality. Soft tissue edema/laceration noted over the left occipital bone. No underlying fracture identified. IMPRESSION: 1. Focal soft tissue edema/laceration overlying the left occipital bone, near the midline, without underlying fracture. 2. Atrophy and chronic ischemic changes in the white matter. 3. No evidence of acute intracranial abnormality. No intracranial mass, hemorrhage, or edema. Electronically Signed   By: Franki Cabot M.D.   On: 05/17/2015 07:26   I have personally reviewed and evaluated these images and lab results as part of my medical decision-making.   EKG Interpretation None      MDM  Final diagnoses:  Laceration of head, initial encounter  Fall, initial encounter  Thoracic back pain, unspecified back pain laterality   Nonfocal neuro exam. However given pt's age and mechanism of injury will get CT head. Given pt's back pain will get x ray of throacic spine though i suspect muscular strain. Will give pain meds.   XR negative. CT head shows soft tissue edema and laceration with no evidence of fracture or other acute intracranial pathology. Given location of laceration staples were used to close the laceration. Care instructions were given as well as instructions for follow-up and staple removal. ER return precautions given.   Anne Ng, PA-C 05/19/15 1509  Orpah Greek, MD 05/30/15 [phone]

## 2015-05-17 NOTE — ED Provider Notes (Signed)
Patient presented to the ER with fall. Patient reports that she had a cramp in her leg, got up to try and walk and her leg gave out. She did fall and hit her head. Complaining of mild headache, mid back pain.  Face to face Exam: HEENT - PERRLA Lungs - CTAB Heart - RRR, no M/R/G Abd - S/NT/ND Neuro - alert, oriented x3 Musculoskeletal - normal range of motion in hips; mild tenderness mid thoracic region.  Plan: CT scan head, staples to scalp. X-ray thoracic spine to rule out compression fracture. Analgesia.  Orpah Greek, MD 05/17/15 [phone]

## 2015-05-17 NOTE — ED Notes (Signed)
Patient transported to CT 

## 2015-05-17 NOTE — ED Notes (Signed)
Patient transported to X-ray 

## 2015-05-17 NOTE — Discharge Instructions (Signed)
You were evaluated in the emergency room today after a mechanical fall. Your CT scan of your head showed some swelling and bruising of your scalp along with the laceration but otherwise had no abnormalities. Your back x-ray showed some chronic changes but no acute injury or abnormality. We closed your head laceration with two staples. You will need to have these checked and removed in about one week. You may keep the area clean with warm water and soap. If you would like, you may apply some bacitracin ointment to the area as well. You do not need to cover with a bandage. Please return to the ER or go to urgent care in one week for staple remove. Return to the ER sooner for any concerning symptoms such as fever, chills, increased bleeding, etc.  Please obtain all of your results from medical records or have your doctors office obtain the results - share them with your doctor - you should be seen at your doctors office in the next 2 days. Call today to arrange your follow up. Take the medications as prescribed. Please review all of the medicines and only take them if you do not have an allergy to them. Please be aware that if you are taking birth control pills, taking other prescriptions, ESPECIALLY ANTIBIOTICS may make the birth control ineffective - if this is the case, either do not engage in sexual activity or use alternative methods of birth control such as condoms until you have finished the medicine and your family doctor says it is OK to restart them. If you are on a blood thinner such as COUMADIN, be aware that any other medicine that you take may cause the coumadin to either work too much, or not enough - you should have your coumadin level rechecked in next 7 days if this is the case.  ?  It is also a possibility that you have an allergic reaction to any of the medicines that you have been prescribed - Everybody reacts differently to medications and while MOST people have no trouble with most medicines,  you may have a reaction such as nausea, vomiting, rash, swelling, shortness of breath. If this is the case, please stop taking the medicine immediately and contact your physician.  ?  You should return to the ER if you develop severe or worsening symptoms.

## 2015-05-17 NOTE — ED Notes (Signed)
MD at bedside. 

## 2015-06-16 ENCOUNTER — Other Ambulatory Visit: Payer: Self-pay | Admitting: Orthopedic Surgery

## 2015-06-17 ENCOUNTER — Encounter (HOSPITAL_COMMUNITY)
Admission: RE | Admit: 2015-06-17 | Discharge: 2015-06-17 | Disposition: A | Discharge status: Home / Self Care | Payer: Medicare Other | Source: Ambulatory Visit | Attending: Orthopedic Surgery | Admitting: Orthopedic Surgery

## 2015-06-17 ENCOUNTER — Encounter (HOSPITAL_COMMUNITY): Payer: Self-pay

## 2015-06-17 DIAGNOSIS — E7800 Pure hypercholesterolemia, unspecified: Secondary | ICD-10-CM | POA: Diagnosis not present

## 2015-06-17 DIAGNOSIS — K589 Irritable bowel syndrome without diarrhea: Secondary | ICD-10-CM | POA: Diagnosis not present

## 2015-06-17 DIAGNOSIS — M4854XA Collapsed vertebra, not elsewhere classified, thoracic region, initial encounter for fracture: Secondary | ICD-10-CM | POA: Diagnosis not present

## 2015-06-17 DIAGNOSIS — Z01818 Encounter for other preprocedural examination: Secondary | ICD-10-CM

## 2015-06-17 DIAGNOSIS — Z7983 Long term (current) use of bisphosphonates: Secondary | ICD-10-CM | POA: Diagnosis not present

## 2015-06-17 DIAGNOSIS — M1990 Unspecified osteoarthritis, unspecified site: Secondary | ICD-10-CM | POA: Diagnosis not present

## 2015-06-17 DIAGNOSIS — K219 Gastro-esophageal reflux disease without esophagitis: Secondary | ICD-10-CM | POA: Diagnosis not present

## 2015-06-17 HISTORY — DX: Malignant (primary) neoplasm, unspecified: C80.1

## 2015-06-17 HISTORY — DX: Unspecified osteoarthritis, unspecified site: M19.90

## 2015-06-17 HISTORY — DX: Calculus of kidney: N20.0

## 2015-06-17 LAB — URINALYSIS, ROUTINE W REFLEX MICROSCOPIC
BILIRUBIN URINE: NEGATIVE
Glucose, UA: NEGATIVE mg/dL
HGB URINE DIPSTICK: NEGATIVE
Ketones, ur: NEGATIVE mg/dL
Leukocytes, UA: NEGATIVE
Nitrite: NEGATIVE
PROTEIN: NEGATIVE mg/dL
Specific Gravity, Urine: 1.01 (ref 1.005–1.030)
pH: 6 (ref 5.0–8.0)

## 2015-06-17 LAB — COMPREHENSIVE METABOLIC PANEL
ALT: 16 U/L (ref 14–54)
ANION GAP: 12 (ref 5–15)
AST: 21 U/L (ref 15–41)
Albumin: 4.2 g/dL (ref 3.5–5.0)
Alkaline Phosphatase: 71 U/L (ref 38–126)
BUN: 12 mg/dL (ref 6–20)
CO2: 27 mmol/L (ref 22–32)
Calcium: 10.2 mg/dL (ref 8.9–10.3)
Chloride: 104 mmol/L (ref 101–111)
Creatinine, Ser: 0.75 mg/dL (ref 0.44–1.00)
GFR calc Af Amer: >60 mL/min (ref >60)
GFR calc non Af Amer: >60 mL/min (ref >60)
Glucose, Bld: 119 mg/dL — ABNORMAL HIGH (ref 65–99)
POTASSIUM: 4.5 mmol/L (ref 3.5–5.1)
SODIUM: 143 mmol/L (ref 135–145)
TOTAL PROTEIN: 7.8 g/dL (ref 6.5–8.1)
Total Bilirubin: 0.7 mg/dL (ref 0.3–1.2)

## 2015-06-17 LAB — CBC WITH DIFFERENTIAL/PLATELET
BASOS PCT: 0 %
Basophils Absolute: 0 10*3/uL (ref 0.0–0.1)
Eosinophils Absolute: 0.1 10*3/uL (ref 0.0–0.7)
Eosinophils Relative: 1 %
HEMATOCRIT: 48.6 % — AB (ref 36.0–46.0)
HEMOGLOBIN: 16.1 g/dL — AB (ref 12.0–15.0)
Lymphocytes Relative: 35 %
Lymphs Abs: 3.2 10*3/uL (ref 0.7–4.0)
MCH: 29.6 pg (ref 26.0–34.0)
MCHC: 33.1 g/dL (ref 30.0–36.0)
MCV: 89.3 fL (ref 78.0–100.0)
MONOS PCT: 7 %
Monocytes Absolute: 0.6 10*3/uL (ref 0.1–1.0)
NEUTROS ABS: 5.4 10*3/uL (ref 1.7–7.7)
Neutrophils Relative %: 57 %
Platelets: 304 10*3/uL (ref 150–400)
RBC: 5.44 MIL/uL — ABNORMAL HIGH (ref 3.87–5.11)
RDW: 13 % (ref 11.5–15.5)
WBC: 9.3 10*3/uL (ref 4.0–10.5)

## 2015-06-17 LAB — PROTIME-INR
INR: 1.15 (ref 0.00–1.49)
Prothrombin Time: 14.9 seconds (ref 11.6–15.2)

## 2015-06-17 LAB — SURGICAL PCR SCREEN
MRSA, PCR: NEGATIVE
Staphylococcus aureus: NEGATIVE

## 2015-06-17 LAB — APTT: APTT: 30 s (ref 24–37)

## 2015-06-17 MED ORDER — POVIDONE-IODINE 7.5 % EX SOLN
Freq: Once | CUTANEOUS | Status: DC
  Filled 2015-06-17: qty 118

## 2015-06-17 MED ORDER — CEFAZOLIN SODIUM-DEXTROSE 2-3 GM-% IV SOLR
2.0000 g | INTRAVENOUS | Status: AC
  Administered 2015-06-18: 2 g via INTRAVENOUS
  Filled 2015-06-17: qty 50

## 2015-06-17 NOTE — Progress Notes (Signed)
PCP is Dr Gaynelle Arabian Cardioligist is Dr. Marlou Porch- last saw him 07-12-2013 Stress test noted in epic from 07-16-2013 Denies chest pain

## 2015-06-17 NOTE — Progress Notes (Signed)
Anesthesia Chart Review:  Pt is 82 year old female scheduled for T9 kyphoplasty on 06/18/2015 with Dr. Lynann Bologna.   PMH includes:  Hyperlipidemia, post-op N/V, GERD. Never smoker. BMI 23  Medications include: protonix  Preoperative labs reviewed.    Chest x-ray 06/17/15 reviewed. No active cardiopulmonary disease.   EKG 06/17/15: Normal sinus rhythm. LVH with repolarization abnormality  Nuclear stress test 07/25/13: Normal stress nuclear study. LV Ejection Fraction: 76%. LV Wall Motion: NL LV Function; NL Wall Motion  If no changes, I anticipate pt can proceed with surgery as scheduled.   Willeen Cass, FNP-BC [facility]/Anesthesiology Phone: [phone] 06/17/2015 3:29 PM

## 2015-06-17 NOTE — H&P (Signed)
PREOPERATIVE H&P  Chief Complaint: mid back pain  HPI: Yolanda Phillips is a 82 y.o. female who presents with ongoing pain in the mid back  MRI reveals acute compression fracture at T9  Patient has failed multiple forms of conservative care and continues to have pain (see office notes for additional details regarding the patient's full course of treatment)  Past Medical History  Diagnosis Date  . GERD (gastroesophageal reflux disease)   . Osteoporosis   . PONV (postoperative nausea and vomiting)   . Costochondritis   . Diverticulosis   . IBS (irritable bowel syndrome)   . Hypercholesterolemia   . Diverticulosis   . Kidney stone   . Arthritis   . Cancer [facility])     removed skin cancer from her arm   Past Surgical History  Procedure Laterality Date  . Cholecystectomy    . Abdominal hysterectomy    . Facial cosmetic surgery      20 + years ago  . Tonsillectomy    . Eye surgery      bilateral cataracts  . Melanoma excision Right 01/14/2013    Procedure: WIDE LOCAL EXCISION RIGHT WRIST MELANOMA ;  Surgeon: Madilyn Hook, DO;  Location: [place];  Service: General;  Laterality: Right;  . Breast surgery Bilateral     implants  . Colonoscopy     Social History   Social History  . Marital Status: Single    Spouse Name: N/A  . Number of Children: N/A  . Years of Education: N/A   Social History Main Topics  . Smoking status: Never Smoker   . Smokeless tobacco: Never Used  . Alcohol Use: No  . Drug Use: No  . Sexual Activity: Not on file   Other Topics Concern  . Not on file   Social History Narrative   Family History  Problem Relation Age of Onset  . Multiple myeloma Mother   . Hypertension Father    Allergies  Allergen Reactions  . Fosamax [Alendronate Sodium] Other (See Comments)    GERDS  . Boniva [Ibandronic Acid] Diarrhea  . Evista [Raloxifene] Diarrhea  . Levbid [Hyoscyamine] Other (See Comments)    Cramps  . Nexium [Esomeprazole Magnesium] Diarrhea    . Norco [Hydrocodone-Acetaminophen] Other (See Comments)    Severe constipation   . Prevacid [Lansoprazole] Diarrhea  . Morphine And Related Other (See Comments)    Made patient very dizzy   Prior to Admission medications   Medication Sig Start Date End Date Taking? Authorizing Provider  calcium-vitamin D (OSCAL WITH D) 500-200 MG-UNIT per tablet Take 1 tablet by mouth 2 (two) times daily.   Yes Historical Provider, MD  COD LIVER OIL/VITAMINS A & D CAPS Take by mouth. 415 mg cod liver oil +A and D   Yes Historical Provider, MD  HYDROcodone-acetaminophen (NORCO/VICODIN) 5-325 MG tablet Take 1 tablet by mouth every 6 (six) hours as needed. 05/17/15  Yes Olivia Canter Sam, PA-C  methocarbamol (ROBAXIN) 500 MG tablet Take 1 tablet (500 mg total) by mouth 2 (two) times daily. 05/17/15  Yes Olivia Canter Sam, PA-C  Multiple Vitamin (MULTIVITAMIN WITH MINERALS) TABS Take 1 tablet by mouth daily. One a Day Women's 50 plus   Yes Historical Provider, MD  pantoprazole (PROTONIX) 40 MG tablet Take 40 mg by mouth as needed.  12/26/12  Yes Historical Provider, MD  zoledronic acid (RECLAST) 5 MG/100ML SOLN injection Inject 5 mg into the vein. Yearly   Yes Historical Provider, MD  All other systems have been reviewed and were otherwise negative with the exception of those mentioned in the HPI and as above.  Physical Exam: There were no vitals filed for this visit.  General: Alert, no acute distress Cardiovascular: No pedal edema Respiratory: No cyanosis, no use of accessory musculature Skin: No lesions in the area of chief complaint Neurologic: Sensation intact distally Psychiatric: Patient is competent for consent with normal mood and affect Lymphatic: No axillary or cervical lymphadenopathy  MUSCULOSKELETAL: + TTP mid back  Assessment/Plan: T9 compression fracture Plan for Procedure(s): KYPHOPLASTY   Sinclair Ship, MD 06/17/2015 12:08 PM

## 2015-06-17 NOTE — Pre-Procedure Instructions (Signed)
    Yolanda Phillips  06/17/2015      [id number] - , [place] [address] [place] [address] [place] [address] Phone: [phone] Fax: [phone]    Your procedure is scheduled on Thursday, June 18, 2015  Report to [facility] Admitting at 9:30 A.M.  Call this number if you have problems the morning of surgery:  [phone]   Remember:  Do not eat food or drink liquids after midnight.  Take these medicines the morning of surgery with A SIP OF WATER : if needed: HYDROcodone-acetaminophen (NORCO/VICODIN) for pain, pantoprazole (PROTONIX)  Stop taking Aspirin, vitamins and herbal medications such as COD LIVER OIL/VITAMINS A & D.  Do not take any NSAIDs ie: Ibuprofen, Advil, Naproxen or any medication containing Aspirin; stop now.   Do not wear jewelry, make-up or nail polish.  Do not wear lotions, powders, or perfumes.  You may not wear deodorant.  Do not shave 48 hours prior to surgery.    Do not bring valuables to the hospital.  [facility] is not responsible for any belongings or valuables.  Contacts, dentures or bridgework may not be worn into surgery.  Leave your suitcase in the car.  After surgery it may be brought to your room.  For patients admitted to the hospital, discharge time will be determined by your treatment team.  Patients discharged the day of surgery will not be allowed to drive home.   Name and phone number of your driver: Special instructions: Shower the night before surgery and the morning of surgery with CHG.  Please read over the following fact sheets that you were given. Pain Booklet, Coughing and Deep Breathing, MRSA Information and Surgical Site Infection Prevention

## 2015-06-18 ENCOUNTER — Ambulatory Visit (HOSPITAL_COMMUNITY): Payer: Medicare Other

## 2015-06-18 ENCOUNTER — Encounter (HOSPITAL_COMMUNITY)
Admission: RE | Disposition: A | Discharge status: Home / Self Care | Payer: Self-pay | Source: Ambulatory Visit | Attending: Orthopedic Surgery

## 2015-06-18 ENCOUNTER — Ambulatory Visit (HOSPITAL_COMMUNITY): Payer: Medicare Other | Admitting: Emergency Medicine

## 2015-06-18 ENCOUNTER — Encounter (HOSPITAL_COMMUNITY): Payer: Self-pay | Admitting: Surgery

## 2015-06-18 ENCOUNTER — Ambulatory Visit (HOSPITAL_COMMUNITY): Payer: Medicare Other | Admitting: Certified Registered Nurse Anesthetist

## 2015-06-18 ENCOUNTER — Ambulatory Visit (HOSPITAL_COMMUNITY)
Admission: RE | Admit: 2015-06-18 | Discharge: 2015-06-18 | Disposition: A | Discharge status: Home / Self Care | Payer: Medicare Other | Source: Ambulatory Visit | Attending: Orthopedic Surgery | Admitting: Orthopedic Surgery

## 2015-06-18 DIAGNOSIS — E7800 Pure hypercholesterolemia, unspecified: Secondary | ICD-10-CM | POA: Insufficient documentation

## 2015-06-18 DIAGNOSIS — K219 Gastro-esophageal reflux disease without esophagitis: Secondary | ICD-10-CM | POA: Insufficient documentation

## 2015-06-18 DIAGNOSIS — M1990 Unspecified osteoarthritis, unspecified site: Secondary | ICD-10-CM | POA: Insufficient documentation

## 2015-06-18 DIAGNOSIS — K589 Irritable bowel syndrome without diarrhea: Secondary | ICD-10-CM | POA: Insufficient documentation

## 2015-06-18 DIAGNOSIS — Z7983 Long term (current) use of bisphosphonates: Secondary | ICD-10-CM | POA: Insufficient documentation

## 2015-06-18 DIAGNOSIS — M4854XA Collapsed vertebra, not elsewhere classified, thoracic region, initial encounter for fracture: Secondary | ICD-10-CM | POA: Diagnosis not present

## 2015-06-18 DIAGNOSIS — Z419 Encounter for procedure for purposes other than remedying health state, unspecified: Secondary | ICD-10-CM

## 2015-06-18 HISTORY — PX: KYPHOPLASTY: SHX5884

## 2015-06-18 SURGERY — KYPHOPLASTY
Anesthesia: General | Site: Back

## 2015-06-18 MED ORDER — ACETAMINOPHEN 10 MG/ML IV SOLN
INTRAVENOUS | Status: AC
  Filled 2015-06-18: qty 100

## 2015-06-18 MED ORDER — 0.9 % SODIUM CHLORIDE (POUR BTL) OPTIME
TOPICAL | Status: DC | PRN
  Administered 2015-06-18: 1000 mL

## 2015-06-18 MED ORDER — BUPIVACAINE-EPINEPHRINE (PF) 0.25% -1:200000 IJ SOLN
INTRAMUSCULAR | Status: AC
  Filled 2015-06-18: qty 30

## 2015-06-18 MED ORDER — SUGAMMADEX SODIUM 200 MG/2ML IV SOLN
INTRAVENOUS | Status: AC
  Filled 2015-06-18: qty 2

## 2015-06-18 MED ORDER — LIDOCAINE HCL (CARDIAC) 20 MG/ML IV SOLN
INTRAVENOUS | Status: DC | PRN
  Administered 2015-06-18: 70 mg via INTRAVENOUS

## 2015-06-18 MED ORDER — FENTANYL CITRATE (PF) 100 MCG/2ML IJ SOLN
INTRAMUSCULAR | Status: DC | PRN
  Administered 2015-06-18 (×2): 50 ug via INTRAVENOUS

## 2015-06-18 MED ORDER — ROCURONIUM BROMIDE 50 MG/5ML IV SOLN
INTRAVENOUS | Status: AC
  Filled 2015-06-18: qty 1

## 2015-06-18 MED ORDER — SUGAMMADEX SODIUM 200 MG/2ML IV SOLN
INTRAVENOUS | Status: DC | PRN
  Administered 2015-06-18: 150 mg via INTRAVENOUS

## 2015-06-18 MED ORDER — DEXAMETHASONE SODIUM PHOSPHATE 10 MG/ML IJ SOLN
INTRAMUSCULAR | Status: AC
  Filled 2015-06-18: qty 2

## 2015-06-18 MED ORDER — FENTANYL CITRATE (PF) 250 MCG/5ML IJ SOLN
INTRAMUSCULAR | Status: AC
  Filled 2015-06-18: qty 5

## 2015-06-18 MED ORDER — PROPOFOL 10 MG/ML IV BOLUS
INTRAVENOUS | Status: AC
  Filled 2015-06-18: qty 20

## 2015-06-18 MED ORDER — BACITRACIN 500 UNIT/GM EX OINT
TOPICAL_OINTMENT | CUTANEOUS | Status: DC | PRN
  Administered 2015-06-18: 1 via TOPICAL

## 2015-06-18 MED ORDER — LIDOCAINE HCL (CARDIAC) 20 MG/ML IV SOLN
INTRAVENOUS | Status: AC
  Filled 2015-06-18: qty 5

## 2015-06-18 MED ORDER — ACETAMINOPHEN 10 MG/ML IV SOLN
1000.0000 mg | Freq: Once | INTRAVENOUS | Status: AC
  Administered 2015-06-18: 1000 mg via INTRAVENOUS

## 2015-06-18 MED ORDER — ONDANSETRON HCL 4 MG/2ML IJ SOLN
INTRAMUSCULAR | Status: DC | PRN
  Administered 2015-06-18: 4 mg via INTRAVENOUS

## 2015-06-18 MED ORDER — PROPOFOL 10 MG/ML IV BOLUS
INTRAVENOUS | Status: DC | PRN
  Administered 2015-06-18: 100 mg via INTRAVENOUS

## 2015-06-18 MED ORDER — IOHEXOL 300 MG/ML  SOLN
INTRAMUSCULAR | Status: DC | PRN
  Administered 2015-06-18: 1 mL

## 2015-06-18 MED ORDER — BACITRACIN ZINC 500 UNIT/GM EX OINT
TOPICAL_OINTMENT | CUTANEOUS | Status: AC
  Filled 2015-06-18: qty 28.35

## 2015-06-18 MED ORDER — ROCURONIUM BROMIDE 100 MG/10ML IV SOLN
INTRAVENOUS | Status: DC | PRN
  Administered 2015-06-18: 40 mg via INTRAVENOUS

## 2015-06-18 MED ORDER — ONDANSETRON HCL 4 MG/2ML IJ SOLN
INTRAMUSCULAR | Status: AC
  Filled 2015-06-18: qty 4

## 2015-06-18 MED ORDER — EPHEDRINE SULFATE 50 MG/ML IJ SOLN
INTRAMUSCULAR | Status: DC | PRN
  Administered 2015-06-18: 10 mg via INTRAVENOUS

## 2015-06-18 MED ORDER — LACTATED RINGERS IV SOLN
INTRAVENOUS | Status: DC
Start: 2015-06-18 — End: 2015-06-18
  Administered 2015-06-18: 10:00:00 via INTRAVENOUS

## 2015-06-18 SURGICAL SUPPLY — 44 items
BANDAGE ADH SHEER 1  50/CT (GAUZE/BANDAGES/DRESSINGS) ×6 IMPLANT
BLADE SURG 15 STRL LF DISP TIS (BLADE) ×1 IMPLANT
BLADE SURG 15 STRL SS (BLADE) ×2
CEMENT BONE KYPHX HV R (Orthopedic Implant) ×3 IMPLANT
COVER MAYO STAND STRL (DRAPES) ×3 IMPLANT
COVER SURGICAL LIGHT HANDLE (MISCELLANEOUS) ×3 IMPLANT
CURETTE WEDGE 8.5MM KYPHX (MISCELLANEOUS) IMPLANT
DERMABOND ADVANCED (GAUZE/BANDAGES/DRESSINGS)
DERMABOND ADVANCED .7 DNX12 (GAUZE/BANDAGES/DRESSINGS) IMPLANT
DRAPE C-ARM 42X72 X-RAY (DRAPES) ×3 IMPLANT
DRAPE INCISE IOBAN 66X45 STRL (DRAPES) ×3 IMPLANT
DRAPE LAPAROTOMY T 102X78X121 (DRAPES) ×3 IMPLANT
DRAPE SURG 17X23 STRL (DRAPES) ×12 IMPLANT
DURAPREP 26ML APPLICATOR (WOUND CARE) ×3 IMPLANT
GAUZE SPONGE 2X2 8PLY STRL LF (GAUZE/BANDAGES/DRESSINGS) ×1 IMPLANT
GAUZE SPONGE 4X4 16PLY XRAY LF (GAUZE/BANDAGES/DRESSINGS) ×3 IMPLANT
GLOVE BIO SURGEON STRL SZ7 (GLOVE) ×9 IMPLANT
GLOVE BIO SURGEON STRL SZ8 (GLOVE) ×3 IMPLANT
GLOVE BIOGEL PI IND STRL 7.0 (GLOVE) ×2 IMPLANT
GLOVE BIOGEL PI IND STRL 8 (GLOVE) ×1 IMPLANT
GLOVE BIOGEL PI INDICATOR 7.0 (GLOVE) ×4
GLOVE BIOGEL PI INDICATOR 8 (GLOVE) ×2
GOWN STRL REUS W/ TWL LRG LVL3 (GOWN DISPOSABLE) ×2 IMPLANT
GOWN STRL REUS W/ TWL XL LVL3 (GOWN DISPOSABLE) ×1 IMPLANT
GOWN STRL REUS W/TWL LRG LVL3 (GOWN DISPOSABLE) ×4
GOWN STRL REUS W/TWL XL LVL3 (GOWN DISPOSABLE) ×2
KIT BASIN OR (CUSTOM PROCEDURE TRAY) ×3 IMPLANT
KIT ROOM TURNOVER OR (KITS) ×3 IMPLANT
NEEDLE 22X1 1/2 (OR ONLY) (NEEDLE) IMPLANT
NEEDLE HYPO 25X1 1.5 SAFETY (NEEDLE) IMPLANT
NEEDLE SPNL 18GX3.5 QUINCKE PK (NEEDLE) ×6 IMPLANT
NS IRRIG 1000ML POUR BTL (IV SOLUTION) ×3 IMPLANT
PACK SURGICAL SETUP 50X90 (CUSTOM PROCEDURE TRAY) ×3 IMPLANT
PAD ARMBOARD 7.5X6 YLW CONV (MISCELLANEOUS) ×6 IMPLANT
POSITIONER HEAD PRONE TRACH (MISCELLANEOUS) ×3 IMPLANT
SPONGE GAUZE 2X2 STER 10/PKG (GAUZE/BANDAGES/DRESSINGS) ×2
SUT MNCRL AB 4-0 PS2 18 (SUTURE) ×3 IMPLANT
SYR BULB IRRIGATION 50ML (SYRINGE) ×3 IMPLANT
SYR CONTROL 10ML LL (SYRINGE) ×3 IMPLANT
TAPE CLOTH SOFT 2X10 (GAUZE/BANDAGES/DRESSINGS) ×3 IMPLANT
TOWEL OR 17X24 6PK STRL BLUE (TOWEL DISPOSABLE) ×3 IMPLANT
TOWEL OR 17X26 10 PK STRL BLUE (TOWEL DISPOSABLE) ×3 IMPLANT
TRAY KYPHOPAK 15/2 EXPRESS (KITS) ×3 IMPLANT
TRAY KYPHOPAK 15/3 ONESTEP 1ST (MISCELLANEOUS) IMPLANT

## 2015-06-18 NOTE — Anesthesia Preprocedure Evaluation (Signed)
Anesthesia Evaluation  Patient identified by MRN, date of birth, ID band Patient awake    Reviewed: Allergy & Precautions, NPO status , Patient's Chart, lab work & pertinent test results  History of Anesthesia Complications (+) PONV  Airway Mallampati: II  TM Distance: >3 FB Neck ROM: Full    Dental no notable dental hx.    Pulmonary neg pulmonary ROS,    Pulmonary exam normal breath sounds clear to auscultation       Cardiovascular negative cardio ROS Normal cardiovascular exam Rhythm:Regular Rate:Normal     Neuro/Psych negative neurological ROS  negative psych ROS   GI/Hepatic Neg liver ROS, GERD  Medicated,  Endo/Other  negative endocrine ROS  Renal/GU negative Renal ROS  negative genitourinary   Musculoskeletal negative musculoskeletal ROS (+)   Abdominal   Peds negative pediatric ROS (+)  Hematology negative hematology ROS (+)   Anesthesia Other Findings   Reproductive/Obstetrics negative OB ROS                             Anesthesia Physical Anesthesia Plan  ASA: II  Anesthesia Plan: General   Post-op Pain Management:    Induction: Intravenous  Airway Management Planned: Oral ETT  Additional Equipment:   Intra-op Plan:   Post-operative Plan: Extubation in OR  Informed Consent: I have reviewed the patients History and Physical, chart, labs and discussed the procedure including the risks, benefits and alternatives for the proposed anesthesia with the patient or authorized representative who has indicated his/her understanding and acceptance.   Dental advisory given  Plan Discussed with: CRNA and Surgeon  Anesthesia Plan Comments:         Anesthesia Quick Evaluation

## 2015-06-18 NOTE — Transfer of Care (Signed)
Immediate Anesthesia Transfer of Care Note  Patient: Yolanda Phillips  Procedure(s) Performed: Procedure(s) with comments: KYPHOPLASTY (N/A) - Thoracic 9 kyphoplasty  Patient Location: PACU  Anesthesia Type:General  Level of Consciousness: awake and patient cooperative  Airway & Oxygen Therapy: Patient Spontanous Breathing  Post-op Assessment: Report given to RN, Post -op Vital signs reviewed and stable and Patient moving all extremities X 4  Post vital signs: Reviewed and stable  Last Vitals:  Filed Vitals:   06/18/15 0931  BP: 156/85  Pulse: 76  Temp: 36.8 C  Resp: 20    Complications: No apparent anesthesia complications

## 2015-06-18 NOTE — Anesthesia Postprocedure Evaluation (Signed)
Anesthesia Post Note  Patient: Yolanda Phillips  Procedure(s) Performed: Procedure(s) (LRB): KYPHOPLASTY (N/A)  Patient location during evaluation: PACU Anesthesia Type: General Level of consciousness: awake and alert Pain management: pain level controlled Vital Signs Assessment: post-procedure vital signs reviewed and stable Respiratory status: spontaneous breathing, nonlabored ventilation, respiratory function stable and patient connected to nasal cannula oxygen Cardiovascular status: blood pressure returned to baseline and stable Postop Assessment: no signs of nausea or vomiting Anesthetic complications: no    Last Vitals:  Filed Vitals:   06/18/15 1415 06/18/15 1418  BP:  138/70  Pulse: 77 77  Temp:    Resp: 12 11    Last Pain:  Filed Vitals:   06/18/15 1425  PainSc: 0-No pain                 Daniyah Fohl L

## 2015-06-18 NOTE — Anesthesia Procedure Notes (Signed)
Procedure Name: Intubation Date/Time: 06/18/2015 12:25 PM Performed by: Rogers Blocker Pre-anesthesia Checklist: Patient identified, Timeout performed, Emergency Drugs available, Suction available and Patient being monitored Patient Re-evaluated:Patient Re-evaluated prior to inductionOxygen Delivery Method: Circle system utilized Preoxygenation: Pre-oxygenation with 100% oxygen Intubation Type: IV induction Ventilation: Mask ventilation without difficulty Laryngoscope Size: Miller and 2 Grade View: Grade I Tube type: Oral Tube size: 7.0 mm Number of attempts: 1 Airway Equipment and Method: Stylet Placement Confirmation: ETT inserted through vocal cords under direct vision,  breath sounds checked- equal and bilateral,  positive ETCO2 and CO2 detector Secured at: 22 cm Tube secured with: Tape Dental Injury: Teeth and Oropharynx as per pre-operative assessment

## 2015-06-19 ENCOUNTER — Encounter (HOSPITAL_COMMUNITY): Payer: Self-pay | Admitting: Orthopedic Surgery

## 2015-06-19 NOTE — Op Note (Signed)
NAME:  Yolanda Phillips, Yolanda Phillips NO.:  [id number]  MEDICAL RECORD NO.:  [id number]  LOCATION:  [facility]                         FACILITY:  [facility]  PHYSICIAN:  Phylliss Bob, MD      DATE OF BIRTH:  [birth year]  DATE OF PROCEDURE:  06/18/2015 DATE OF DISCHARGE:  06/18/2015                              OPERATIVE REPORT   PREOPERATIVE DIAGNOSIS:  Subacute T9 compression fracture.  POSTOPERATIVE DIAGNOSIS:  Subacute T9 compression fracture.  PROCEDURE:  T9 kyphoplasty using a bipedicular approach.  SURGEON:  Phylliss Bob, MD.  ASSISTANTPricilla Holm, PA-C.  ANESTHESIA:  General endotracheal anesthesia.  COMPLICATIONS:  None.  DISPOSITION:  Stable.  ESTIMATED BLOOD LOSS:  Minimal.  INDICATIONS FOR SURGERY:  Briefly, Yolanda Phillips is a pleasant 82 year old female, who did have previous compression fractures treated nonoperatively.  However, about a month prior to her evaluation by me, she did have an acute onset of pain.  An MRI did reveal a subacute T9 compression fracture.  Old compression deformities were noted at other vertebral bodies, but T9 did appear to be the acute pain generator.  We therefore did discuss various options of treatment, including nonoperative measures, including proceeding with a kyphoplasty procedure.  The patient did elect to proceed.  The patient did fully understand the risks and limitations of the procedure as outlined in my preoperative note.  OPERATIVE DETAILS:  On June 18, 2015, the patient was brought to surgery and general endotracheal anesthesia was administered.  The patient was placed prone on a well-padded flat Jackson bed.  Gel rolls were placed under the patient's chest and hips.  The patient's back was prepped and draped in the usual sterile fashion.  A time-out procedure was performed, and antibiotics were given.  I then made 2 very small stab incisions just lateral to the T9 pedicles bilaterally.  I then advanced trocars  through the T9 pedicles.  I then drilled through the trocars.  Kyphoplasty balloons were inserted bilaterally and inflated with approximately 1 mL of contrast.  Partial restoration of the superior endplate height was noted.  I then introduced a total of approximately 3 mL of cement into the vertebral body.  There was no abnormal extravasation of cement into the spinal canal or anteriorly or laterally.  The cement was then allowed to harden.  The trocars were then removed.  The wounds were irrigated.  The wounds were then closed with 4-0 Monocryl.  Bacitracin and a sterile dressing was applied.  The patient was then awakened from general endotracheal anesthesia and transferred to recovery in stable condition.  All instrument counts were correct at the termination of the procedure.     Phylliss Bob, MD     MD/MEDQ  D:  06/18/2015  T:  06/19/2015  Job:  [id number]

## 2015-09-02 ENCOUNTER — Other Ambulatory Visit (HOSPITAL_COMMUNITY): Payer: Self-pay | Admitting: *Deleted

## 2015-09-03 ENCOUNTER — Ambulatory Visit (HOSPITAL_COMMUNITY)
Admission: RE | Admit: 2015-09-03 | Discharge: 2015-09-03 | Disposition: A | Discharge status: Home / Self Care | Payer: Medicare Other | Source: Ambulatory Visit | Attending: Family Medicine | Admitting: Family Medicine

## 2015-09-03 DIAGNOSIS — M810 Age-related osteoporosis without current pathological fracture: Secondary | ICD-10-CM | POA: Insufficient documentation

## 2015-09-03 MED ORDER — ZOLEDRONIC ACID 5 MG/100ML IV SOLN
5.0000 mg | Freq: Once | INTRAVENOUS | Status: AC
  Administered 2015-09-03: 5 mg via INTRAVENOUS

## 2015-09-03 MED ORDER — ZOLEDRONIC ACID 5 MG/100ML IV SOLN
INTRAVENOUS | Status: AC
  Filled 2015-09-03: qty 100

## 2015-10-03 ENCOUNTER — Emergency Department (HOSPITAL_COMMUNITY)
Admission: EM | Admit: 2015-10-03 | Discharge: 2015-10-03 | Disposition: A | Discharge status: Home / Self Care | Payer: No Typology Code available for payment source | Attending: Emergency Medicine | Admitting: Emergency Medicine

## 2015-10-03 ENCOUNTER — Emergency Department (HOSPITAL_COMMUNITY): Payer: No Typology Code available for payment source

## 2015-10-03 ENCOUNTER — Encounter (HOSPITAL_COMMUNITY): Payer: Self-pay | Admitting: *Deleted

## 2015-10-03 DIAGNOSIS — M1990 Unspecified osteoarthritis, unspecified site: Secondary | ICD-10-CM | POA: Insufficient documentation

## 2015-10-03 DIAGNOSIS — Y939 Activity, unspecified: Secondary | ICD-10-CM | POA: Insufficient documentation

## 2015-10-03 DIAGNOSIS — E7800 Pure hypercholesterolemia, unspecified: Secondary | ICD-10-CM | POA: Insufficient documentation

## 2015-10-03 DIAGNOSIS — S46912A Strain of unspecified muscle, fascia and tendon at shoulder and upper arm level, left arm, initial encounter: Secondary | ICD-10-CM | POA: Diagnosis not present

## 2015-10-03 DIAGNOSIS — Z85828 Personal history of other malignant neoplasm of skin: Secondary | ICD-10-CM | POA: Insufficient documentation

## 2015-10-03 DIAGNOSIS — Y92410 Unspecified street and highway as the place of occurrence of the external cause: Secondary | ICD-10-CM | POA: Diagnosis not present

## 2015-10-03 DIAGNOSIS — Y999 Unspecified external cause status: Secondary | ICD-10-CM | POA: Diagnosis not present

## 2015-10-03 DIAGNOSIS — Z9049 Acquired absence of other specified parts of digestive tract: Secondary | ICD-10-CM | POA: Diagnosis not present

## 2015-10-03 DIAGNOSIS — Z90710 Acquired absence of both cervix and uterus: Secondary | ICD-10-CM | POA: Insufficient documentation

## 2015-10-03 DIAGNOSIS — M25512 Pain in left shoulder: Secondary | ICD-10-CM | POA: Diagnosis present

## 2015-10-03 DIAGNOSIS — Z79891 Long term (current) use of opiate analgesic: Secondary | ICD-10-CM | POA: Insufficient documentation

## 2015-10-03 HISTORY — DX: Dorsalgia, unspecified: M54.9

## 2015-10-03 HISTORY — DX: Other chronic pain: G89.29

## 2015-10-03 MED ORDER — ACETAMINOPHEN 500 MG PO TABS
1000.0000 mg | ORAL_TABLET | Freq: Once | ORAL | Status: AC
  Administered 2015-10-03: 1000 mg via ORAL
  Filled 2015-10-03: qty 2

## 2015-10-03 NOTE — ED Notes (Signed)
Patient transported to X-ray 
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
## 2015-10-03 NOTE — ED Notes (Signed)
MD at bedside. 

## 2015-10-03 NOTE — ED Notes (Signed)
Bed: WA06 Expected date:  Expected time:  Means of arrival:  Comments: EMS- chest wall pain

## 2015-10-03 NOTE — ED Notes (Signed)
Patient reports pain worse with movement.  Difficult for her to rate.  Left shoulder hurts the worse.
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
## 2015-11-05 ENCOUNTER — Other Ambulatory Visit: Payer: Self-pay | Admitting: Plastic Surgery

## 2015-11-11 ENCOUNTER — Emergency Department (HOSPITAL_COMMUNITY)
Admission: EM | Admit: 2015-11-11 | Discharge: 2015-11-11 | Disposition: A | Discharge status: Home / Self Care | Payer: Medicare Other | Attending: Emergency Medicine | Admitting: Emergency Medicine

## 2015-11-11 ENCOUNTER — Emergency Department (HOSPITAL_BASED_OUTPATIENT_CLINIC_OR_DEPARTMENT_OTHER)
Admit: 2015-11-11 | Discharge: 2015-11-11 | Disposition: A | Discharge status: Home / Self Care | Payer: Medicare Other | Attending: Emergency Medicine | Admitting: Emergency Medicine

## 2015-11-11 ENCOUNTER — Encounter (HOSPITAL_COMMUNITY): Payer: Self-pay | Admitting: Emergency Medicine

## 2015-11-11 DIAGNOSIS — Z85828 Personal history of other malignant neoplasm of skin: Secondary | ICD-10-CM | POA: Insufficient documentation

## 2015-11-11 DIAGNOSIS — Z79899 Other long term (current) drug therapy: Secondary | ICD-10-CM | POA: Insufficient documentation

## 2015-11-11 DIAGNOSIS — M1990 Unspecified osteoarthritis, unspecified site: Secondary | ICD-10-CM | POA: Insufficient documentation

## 2015-11-11 DIAGNOSIS — M79609 Pain in unspecified limb: Secondary | ICD-10-CM | POA: Diagnosis not present

## 2015-11-11 DIAGNOSIS — M7989 Other specified soft tissue disorders: Secondary | ICD-10-CM | POA: Diagnosis not present

## 2015-11-11 DIAGNOSIS — M79661 Pain in right lower leg: Secondary | ICD-10-CM | POA: Diagnosis not present

## 2015-11-11 DIAGNOSIS — M79604 Pain in right leg: Secondary | ICD-10-CM

## 2015-11-11 LAB — CBC WITH DIFFERENTIAL/PLATELET
BASOS ABS: 0 10*3/uL (ref 0.0–0.1)
BASOS PCT: 0 %
Eosinophils Absolute: 0.3 10*3/uL (ref 0.0–0.7)
Eosinophils Relative: 5 %
HCT: 41.6 % (ref 36.0–46.0)
Hemoglobin: 14.2 g/dL (ref 12.0–15.0)
Lymphocytes Relative: 30 %
Lymphs Abs: 2.2 10*3/uL (ref 0.7–4.0)
MCH: 29.7 pg (ref 26.0–34.0)
MCHC: 34.1 g/dL (ref 30.0–36.0)
MCV: 87 fL (ref 78.0–100.0)
Monocytes Absolute: 0.6 10*3/uL (ref 0.1–1.0)
Monocytes Relative: 9 %
NEUTROS ABS: 4 10*3/uL (ref 1.7–7.7)
NEUTROS PCT: 56 %
PLATELETS: 285 10*3/uL (ref 150–400)
RBC: 4.78 MIL/uL (ref 3.87–5.11)
RDW: 13 % (ref 11.5–15.5)
WBC: 7.2 10*3/uL (ref 4.0–10.5)

## 2015-11-11 LAB — BASIC METABOLIC PANEL
ANION GAP: 7 (ref 5–15)
BUN: 15 mg/dL (ref 6–20)
CALCIUM: 9.1 mg/dL (ref 8.9–10.3)
CO2: 27 mmol/L (ref 22–32)
Chloride: 105 mmol/L (ref 101–111)
Creatinine, Ser: 0.7 mg/dL (ref 0.44–1.00)
Glucose, Bld: 120 mg/dL — ABNORMAL HIGH (ref 65–99)
Potassium: 3.9 mmol/L (ref 3.5–5.1)
Sodium: 139 mmol/L (ref 135–145)

## 2015-11-11 NOTE — Progress Notes (Signed)
VASCULAR LAB PRELIMINARY  PRELIMINARY  PRELIMINARY  PRELIMINARY  Right lower extremity venous duplex completed.    Preliminary report:  Right:  No evidence of DVT, superficial thrombosis, or Baker's cyst.  Yolanda Phillips, RVS 11/11/2015, 5:22 PM

## 2015-11-11 NOTE — Discharge Instructions (Signed)
Musculoskeletal Pain Musculoskeletal pain is muscle and boney aches and pains. These pains can occur in any part of the body. Your caregiver may treat you without knowing the cause of the pain. They may treat you if blood or urine tests, X-rays, and other tests were normal.  CAUSES There is often not a definite cause or reason for these pains. These pains may be caused by a type of germ (virus). The discomfort may also come from overuse. Overuse includes working out too hard when your body is not fit. Boney aches also come from weather changes. Bone is sensitive to atmospheric pressure changes. HOME CARE INSTRUCTIONS   Ask when your test results will be ready. Make sure you get your test results.  Only take over-the-counter or prescription medicines for pain, discomfort, or fever as directed by your caregiver. If you were given medications for your condition, do not drive, operate machinery or power tools, or sign legal documents for 24 hours. Do not drink alcohol. Do not take sleeping pills or other medications that may interfere with treatment.  Continue all activities unless the activities cause more pain. When the pain lessens, slowly resume normal activities. Gradually increase the intensity and duration of the activities or exercise.  During periods of severe pain, bed rest may be helpful. Lay or sit in any position that is comfortable.  Putting ice on the injured area.  Put ice in a bag.  Place a towel between your skin and the bag.  Leave the ice on for 15 to 20 minutes, 3 to 4 times a day.  Follow up with your caregiver for continued problems and no reason can be found for the pain. If the pain becomes worse or does not go away, it may be necessary to repeat tests or do additional testing. Your caregiver may need to look further for a possible cause. SEEK IMMEDIATE MEDICAL CARE IF:  You have pain that is getting worse and is not relieved by medications.  You develop chest pain  that is associated with shortness or breath, sweating, feeling sick to your stomach (nauseous), or throw up (vomit).  Your pain becomes localized to the abdomen.  You develop any new symptoms that seem different or that concern you. MAKE SURE YOU:   Understand these instructions.  Will watch your condition.  Will get help right away if you are not doing well or get worse.   This information is not intended to replace advice given to you by your health care provider. Make sure you discuss any questions you have with your health care provider.   Document Released: 05/30/2005 Document Revised: 08/22/2011 Document Reviewed: 02/01/2013 Elsevier Interactive Patient Education 2016 [employer]. Hypertension Hypertension, commonly called high blood pressure, is when the force of blood pumping through your arteries is too strong. Your arteries are the blood vessels that carry blood from your heart throughout your body. A blood pressure reading consists of a higher number over a lower number, such as 110/72. The higher number (systolic) is the pressure inside your arteries when your heart pumps. The lower number (diastolic) is the pressure inside your arteries when your heart relaxes. Ideally you want your blood pressure below 120/80. Hypertension forces your heart to work harder to pump blood. Your arteries may become narrow or stiff. Having untreated or uncontrolled hypertension can cause heart attack, stroke, kidney disease, and other problems. RISK FACTORS Some risk factors for high blood pressure are controllable. Others are not.  Risk factors you cannot  control include:   Race. You may be at higher risk if you are African American.  Age. Risk increases with age.  Gender. Men are at higher risk than women before age 94 years. After age 31, women are at higher risk than men. Risk factors you can control include:  Not getting enough exercise or physical activity.  Being  overweight.  Getting too much fat, sugar, calories, or salt in your diet.  Drinking too much alcohol. SIGNS AND SYMPTOMS Hypertension does not usually cause signs or symptoms. Extremely high blood pressure (hypertensive crisis) may cause headache, anxiety, shortness of breath, and nosebleed. DIAGNOSIS To check if you have hypertension, your health care provider will measure your blood pressure while you are seated, with your arm held at the level of your heart. It should be measured at least twice using the same arm. Certain conditions can cause a difference in blood pressure between your right and left arms. A blood pressure reading that is higher than normal on one occasion does not mean that you need treatment. If it is not clear whether you have high blood pressure, you may be asked to return on a different day to have your blood pressure checked again. Or, you may be asked to monitor your blood pressure at home for 1 or more weeks. TREATMENT Treating high blood pressure includes making lifestyle changes and possibly taking medicine. Living a healthy lifestyle can help lower high blood pressure. You may need to change some of your habits. Lifestyle changes may include:  Following the DASH diet. This diet is high in fruits, vegetables, and whole grains. It is low in salt, red meat, and added sugars.  Keep your sodium intake below 2,300 mg per day.  Getting at least 30-45 minutes of aerobic exercise at least 4 times per week.  Losing weight if necessary.  Not smoking.  Limiting alcoholic beverages.  Learning ways to reduce stress. Your health care provider may prescribe medicine if lifestyle changes are not enough to get your blood pressure under control, and if one of the following is true:  You are 67-31 years of age and your systolic blood pressure is above 140.  You are 17 years of age or older, and your systolic blood pressure is above 150.  Your diastolic blood pressure is  above 90.  You have diabetes, and your systolic blood pressure is over [id number] or your diastolic blood pressure is over 90.  You have kidney disease and your blood pressure is above 140/90.  You have heart disease and your blood pressure is above 140/90. Your personal target blood pressure may vary depending on your medical conditions, your age, and other factors. HOME CARE INSTRUCTIONS  Have your blood pressure rechecked as directed by your health care provider.   Take medicines only as directed by your health care provider. Follow the directions carefully. Blood pressure medicines must be taken as prescribed. The medicine does not work as well when you skip doses. Skipping doses also puts you at risk for problems.  Do not smoke.   Monitor your blood pressure at home as directed by your health care provider. SEEK MEDICAL CARE IF:   You think you are having a reaction to medicines taken.  You have recurrent headaches or feel dizzy.  You have swelling in your ankles.  You have trouble with your vision. SEEK IMMEDIATE MEDICAL CARE IF:  You develop a severe headache or confusion.  You have unusual weakness, numbness, or feel faint.  You have severe chest or abdominal pain.  You vomit repeatedly.  You have trouble breathing. MAKE SURE YOU:   Understand these instructions.  Will watch your condition.  Will get help right away if you are not doing well or get worse.   This information is not intended to replace advice given to you by your health care provider. Make sure you discuss any questions you have with your health care provider.   Document Released: 05/30/2005 Document Revised: 10/14/2014 Document Reviewed: 03/22/2013 Elsevier Interactive Patient Education [employer].
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
## 2015-11-11 NOTE — ED Notes (Signed)
Notified PA, pt ready to go home.

## 2015-11-11 NOTE — ED Notes (Signed)
Patient presents for right leg pain x2-3 days. Had removal of right breast implant approximately one week ago. Went for follow up with PCP today, sent here to rule out DVT. No anticoagulant therapy. Describes pain as cramping, 5/10.

## 2015-11-11 NOTE — ED Provider Notes (Signed)
Medical screening examination/treatment/procedure(s) were conducted as a shared visit with non-physician practitioner(s) and myself.  I personally evaluated the patient during the encounter.   EKG Interpretation None     Patient here because of right calf pain 2-3 days. No PE symptoms. Patient's ultrasound was negative for DVT. Stable for discharge  Lacretia Leigh, MD 11/11/15 [phone]

## 2016-07-24 ENCOUNTER — Encounter (HOSPITAL_COMMUNITY): Payer: Self-pay

## 2016-07-24 ENCOUNTER — Emergency Department (HOSPITAL_COMMUNITY): Payer: Medicare Other

## 2016-07-24 ENCOUNTER — Emergency Department (HOSPITAL_COMMUNITY)
Admission: EM | Admit: 2016-07-24 | Discharge: 2016-07-24 | Disposition: A | Discharge status: Home / Self Care | Payer: Medicare Other | Attending: Emergency Medicine | Admitting: Emergency Medicine

## 2016-07-24 DIAGNOSIS — R112 Nausea with vomiting, unspecified: Secondary | ICD-10-CM

## 2016-07-24 DIAGNOSIS — Z85828 Personal history of other malignant neoplasm of skin: Secondary | ICD-10-CM | POA: Diagnosis not present

## 2016-07-24 DIAGNOSIS — Z7982 Long term (current) use of aspirin: Secondary | ICD-10-CM | POA: Diagnosis not present

## 2016-07-24 DIAGNOSIS — R42 Dizziness and giddiness: Secondary | ICD-10-CM

## 2016-07-24 DIAGNOSIS — I10 Essential (primary) hypertension: Secondary | ICD-10-CM | POA: Insufficient documentation

## 2016-07-24 DIAGNOSIS — Z79899 Other long term (current) drug therapy: Secondary | ICD-10-CM | POA: Insufficient documentation

## 2016-07-24 LAB — COMPREHENSIVE METABOLIC PANEL
ALT: 14 U/L (ref 14–54)
ANION GAP: 11 (ref 5–15)
AST: 21 U/L (ref 15–41)
Albumin: 4.1 g/dL (ref 3.5–5.0)
Alkaline Phosphatase: 50 U/L (ref 38–126)
BUN: 21 mg/dL — ABNORMAL HIGH (ref 6–20)
CHLORIDE: 103 mmol/L (ref 101–111)
CO2: 25 mmol/L (ref 22–32)
CREATININE: 0.9 mg/dL (ref 0.44–1.00)
Calcium: 9.2 mg/dL (ref 8.9–10.3)
GFR calc Af Amer: >60 mL/min (ref >60)
GFR calc non Af Amer: 58 mL/min — ABNORMAL LOW (ref >60)
Glucose, Bld: 160 mg/dL — ABNORMAL HIGH (ref 65–99)
POTASSIUM: 3.9 mmol/L (ref 3.5–5.1)
SODIUM: 139 mmol/L (ref 135–145)
Total Bilirubin: 0.7 mg/dL (ref 0.3–1.2)
Total Protein: 7.4 g/dL (ref 6.5–8.1)

## 2016-07-24 LAB — URINALYSIS, ROUTINE W REFLEX MICROSCOPIC
BACTERIA UA: NONE SEEN
BILIRUBIN URINE: NEGATIVE
Glucose, UA: NEGATIVE mg/dL
HGB URINE DIPSTICK: NEGATIVE
KETONES UR: 5 mg/dL — AB
Nitrite: NEGATIVE
Protein, ur: NEGATIVE mg/dL
Specific Gravity, Urine: 1.02 (ref 1.005–1.030)
pH: 5 (ref 5.0–8.0)

## 2016-07-24 LAB — CBC
HEMATOCRIT: 43.1 % (ref 36.0–46.0)
HEMOGLOBIN: 14.7 g/dL (ref 12.0–15.0)
MCH: 30.1 pg (ref 26.0–34.0)
MCHC: 34.1 g/dL (ref 30.0–36.0)
MCV: 88.1 fL (ref 78.0–100.0)
Platelets: 276 10*3/uL (ref 150–400)
RBC: 4.89 MIL/uL (ref 3.87–5.11)
RDW: 12.9 % (ref 11.5–15.5)
WBC: 7.9 10*3/uL (ref 4.0–10.5)

## 2016-07-24 LAB — LIPASE, BLOOD: LIPASE: 19 U/L (ref 11–51)

## 2016-07-24 MED ORDER — MECLIZINE HCL 25 MG PO TABS: 25.0000 mg | ORAL_TABLET | Freq: Three times a day (TID) | ORAL | 0 refills | Status: DC | PRN

## 2016-07-24 MED ORDER — ONDANSETRON 4 MG PO TBDP
4.0000 mg | ORAL_TABLET | Freq: Once | ORAL | Status: AC | PRN
  Administered 2016-07-24: 4 mg via ORAL
  Filled 2016-07-24: qty 1

## 2016-07-24 MED ORDER — ONDANSETRON 4 MG PO TBDP
4.0000 mg | ORAL_TABLET | Freq: Once | ORAL | Status: AC
  Administered 2016-07-24: 4 mg via ORAL
  Filled 2016-07-24: qty 1

## 2016-07-24 MED ORDER — MECLIZINE HCL 25 MG PO TABS
25.0000 mg | ORAL_TABLET | Freq: Once | ORAL | Status: AC
  Administered 2016-07-24: 25 mg via ORAL
  Filled 2016-07-24: qty 1

## 2016-07-24 NOTE — ED Notes (Signed)
Patient was alert, oriented and stable upon discharge. RN went over AVS and patient had no further questions.  

## 2016-07-24 NOTE — ED Notes (Signed)
Patient transported to MRI 

## 2016-07-24 NOTE — Discharge Instructions (Signed)
Return immediately if you have any sudden severe headache, problems with vision, arm or leg weakness, or intractable vomiting.

## 2016-07-24 NOTE — ED Provider Notes (Signed)
[facility] DEPT Provider Note   CSN: [id number] Arrival date & time: 07/24/16 1511     History    Chief Complaint  Patient presents with  . Dizziness  . Emesis  . Hypertension     HPI Yolanda Phillips is a 83 y.o. female.  83yo F w/ PMH including HTN, GERD who p/w dizziness and nausea/vomiting.  Patient states that this morning she woke up feeling fine and had breakfast as usual. Later in the morning she began having dizziness which she describes as feeling off balance with "muddy" head and "can't focus" feeling. She denies any headache, visual changes, room spinning sensation, extremity numbness/weakness, chest pain, or shortness of breath. She states that she has had associated nausea. She took her normal blood pressure medications but her blood pressure remained elevated which concerned her. When she got to the waiting room she had a few episodes of vomiting. The nausea seems to be intermittent. She denies any fevers, diarrhea, abdominal pain, cough/cold symptoms, or recent illness. She does not smoke. No recent head injury.   Past Medical History:  Diagnosis Date  . Arthritis   . Cancer ([place])    removed skin cancer from her arm  . Chronic back pain   . Costochondritis   . Diverticulosis   . Diverticulosis   . GERD (gastroesophageal reflux disease)   . Hypercholesterolemia   . IBS (irritable bowel syndrome)   . Kidney stone   . Osteoporosis   . PONV (postoperative nausea and vomiting)      Patient Active Problem List   Diagnosis Date Noted  . Atypical chest pain 03/29/2013  . Chest pain 03/26/2013  . GERD (gastroesophageal reflux disease)   . Osteoporosis   . Diverticulosis   . PONV (postoperative nausea and vomiting)   . Costochondritis   . IBS (irritable bowel syndrome)     Past Surgical History:  Procedure Laterality Date  . ABDOMINAL HYSTERECTOMY    . BREAST SURGERY Bilateral    implants  . CHOLECYSTECTOMY    . COLONOSCOPY    . EYE SURGERY     bilateral cataracts  . FACIAL COSMETIC SURGERY     20 + years ago  . KYPHOPLASTY N/A 06/18/2015   Procedure: KYPHOPLASTY;  Surgeon: Phylliss Bob, MD;  Location: [facility];  Service: Orthopedics;  Laterality: N/A;  Thoracic 9 kyphoplasty  . MELANOMA EXCISION Right 01/14/2013   Procedure: WIDE LOCAL EXCISION RIGHT WRIST MELANOMA ;  Surgeon: Madilyn Hook, DO;  Location: [facility];  Service: General;  Laterality: Right;  . TONSILLECTOMY      OB History    No data available        Home Medications    Prior to Admission medications   Medication Sig Start Date End Date Taking? Authorizing Provider  acetaminophen (TYLENOL) 500 MG tablet Take 1,000 mg by mouth every 6 (six) hours as needed.   Yes Historical Provider, MD  aspirin 325 MG tablet Take 325 mg by mouth once.   Yes Historical Provider, MD  calcium-vitamin D (OSCAL WITH D) 500-200 MG-UNIT per tablet Take 1 tablet by mouth 2 (two) times daily.   Yes Historical Provider, MD  COD LIVER OIL/VITAMINS A & D CAPS Take by mouth. 415 mg cod liver oil +A and D   Yes Historical Provider, MD  magnesium oxide (MAG-OX) 400 MG tablet Take 400 mg by mouth daily.   Yes Historical Provider, MD  metoprolol succinate (TOPROL-XL) 50 MG 24 hr tablet Take 1 tablet by  mouth daily. Blood pressure 07/13/16  Yes Historical Provider, MD  Multiple Vitamin (MULTIVITAMIN WITH MINERALS) TABS Take 1 tablet by mouth daily. One a Day Women's 50 plus   Yes Historical Provider, MD  pantoprazole (PROTONIX) 40 MG tablet Take 40 mg by mouth daily. 10/20/15  Yes Historical Provider, MD  zoledronic acid (RECLAST) 5 MG/100ML SOLN injection Inject 5 mg into the vein. Yearly   Yes Historical Provider, MD  meclizine (ANTIVERT) 25 MG tablet Take 1 tablet (25 mg total) by mouth 3 (three) times daily as needed for dizziness. 07/24/16   Sharlett Iles, MD      Family History  Problem Relation Age of Onset  . Multiple myeloma Mother   . Hypertension Father      Social History    Substance Use Topics  . Smoking status: Never Smoker  . Smokeless tobacco: Never Used  . Alcohol use No     Allergies     Fosamax [alendronate sodium]; Boniva [ibandronic acid]; Evista [raloxifene]; Levbid [hyoscyamine]; Nexium [esomeprazole magnesium]; Norco [hydrocodone-acetaminophen]; Prevacid [lansoprazole]; and Morphine and related    Review of Systems  10 Systems reviewed and are negative for acute change except as noted in the HPI.   Physical Exam Updated Vital Signs BP 171/75 (BP Location: Right Arm)   Pulse 64   Temp 97.7 F (36.5 C)   Resp 18   Ht 4' 11"  (1.499 m)   Wt 125 lb (56.7 kg)   SpO2 100%   BMI 25.25 kg/m   Physical Exam  Constitutional: She is oriented to person, place, and time. She appears well-developed and well-nourished. No distress.  Awake, alert  HENT:  Head: Normocephalic and atraumatic.  Right Ear: Tympanic membrane and ear canal normal.  Left Ear: Tympanic membrane and ear canal normal.  Eyes: Conjunctivae and EOM are normal. Pupils are equal, round, and reactive to light.  Neck: Neck supple.  Cardiovascular: Normal rate, regular rhythm and normal heart sounds.   No murmur heard. Pulmonary/Chest: Effort normal and breath sounds normal. No respiratory distress.  Abdominal: Soft. Bowel sounds are normal. She exhibits no distension. There is no tenderness.  Musculoskeletal: She exhibits no edema.  Neurological: She is alert and oriented to person, place, and time. She has normal reflexes. No cranial nerve deficit. She exhibits normal muscle tone. She displays a negative Romberg sign. Gait normal.  Fluent speech, normal finger-to-nose testing, negative pronator drift, no clonus 5/5 strength and normal sensation x all 4 extremities  Skin: Skin is warm and dry.  Psychiatric: She has a normal mood and affect. Judgment and thought content normal.  Nursing note and vitals reviewed.     ED Treatments / Results  Labs (all labs ordered are  listed, but only abnormal results are displayed) Labs Reviewed  COMPREHENSIVE METABOLIC PANEL - Abnormal; Notable for the following:       Result Value   Glucose, Bld 160 (*)    BUN 21 (*)    GFR calc non Af Amer 58 (*)    All other components within normal limits  URINALYSIS, ROUTINE W REFLEX MICROSCOPIC - Abnormal; Notable for the following:    Ketones, ur 5 (*)    Leukocytes, UA SMALL (*)    Squamous Epithelial / LPF 0-5 (*)    All other components within normal limits  URINE CULTURE  LIPASE, BLOOD  CBC     EKG  EKG Interpretation  Date/Time:  Sunday July 24 2016 17:06:03 EST Ventricular Rate:  68 PR  Interval:    QRS Duration: 84 QT Interval:  442 QTC Calculation: 471 R Axis:   -9 Text Interpretation:  Sinus rhythm LVH with secondary repolarization abnormality Tall R wave in V2, consider RVH or PMI No significant change since last tracing Confirmed by Canary Fister MD, Lakendra Helling [phone]) on 07/24/2016 5:14:18 PM         Radiology Ct Head Wo Contrast  Result Date: 07/24/2016 CLINICAL DATA:  Dizziness, hypertension since this morning EXAM: CT HEAD WITHOUT CONTRAST TECHNIQUE: Contiguous axial images were obtained from the base of the skull through the vertex without intravenous contrast. COMPARISON:  05/17/2015 FINDINGS: Brain: No evidence of acute infarction, hemorrhage, extra-axial collection, ventriculomegaly, or mass effect. Generalized cerebral atrophy. Periventricular white matter low attenuation likely secondary to microangiopathy. Vascular: Cerebrovascular atherosclerotic calcifications are noted. Skull: Negative for fracture or focal lesion. Sinuses/Orbits: Visualized portions of the orbits are unremarkable. Visualized portions of the paranasal sinuses and mastoid air cells are unremarkable. Other: None. IMPRESSION: 1. No acute intracranial pathology. 2. Chronic microvascular disease and cerebral atrophy. Electronically Signed   By: Kathreen Devoid   On: 07/24/2016 15:44   Mr  Brain Wo Contrast (neuro Protocol)  Result Date: 07/24/2016 CLINICAL DATA:  Dizziness with nausea and vomiting. EXAM: MRI HEAD WITHOUT CONTRAST TECHNIQUE: Multiplanar, multiecho pulse sequences of the brain and surrounding structures were obtained without intravenous contrast. COMPARISON:  Head CT same day FINDINGS: Brain: Diffusion imaging does not show any acute or subacute infarction. Mild chronic small-vessel ischemic changes affect the pons. No focal cerebellar insult. Cerebral hemispheres show small vessel ischemic changes of the deep and subcortical white matter. Dilated perivascular spaces at the base of the brain. No large vessel territory infarction. No mass lesion, hemorrhage, hydrocephalus or extra-axial collection. No pituitary mass. Vascular: Major vessels at the base of the brain show flow. Skull and upper cervical spine: Negative Sinuses/Orbits: Clear/normal Other: None IMPRESSION: No acute finding. Chronic small-vessel ischemic changes affecting the pons and cerebral hemispheric white matter. Electronically Signed   By: Nelson Chimes M.D.   On: 07/24/2016 20:01    Procedures Procedures (including critical care time) Procedures  Medications Ordered in ED  Medications  ondansetron (ZOFRAN-ODT) disintegrating tablet 4 mg (4 mg Oral Given 07/24/16 1530)  ondansetron (ZOFRAN-ODT) disintegrating tablet 4 mg (4 mg Oral Given 07/24/16 1652)  meclizine (ANTIVERT) tablet 25 mg (25 mg Oral Given 07/24/16 1747)     Initial Impression / Assessment and Plan / ED Course  I have reviewed the triage vital signs and the nursing notes.  Pertinent labs & imaging results that were available during my care of the patient were reviewed by me and considered in my medical decision making (see chart for details).    Pt w/ dizziness/off-balance sensation today associated with nausea and later with vomiting. She was well-appearing on exam, vital signs notable for BP 189/88. Normal neurologic exam including  normal gait, negative Romberg, normal coordination. Head CT obtained from triage is negative for acute process.Labwork shows normal CBC, CMP notable only for mild hyperglycemia. I am concerned about the patient's relatively sudden onset of symptoms and her advanced age. I did recommend MRI of brain to rule out acute stroke. MRI without acute findings. Gave the patient meclizine and on reexamination she stated that her symptoms were much improved with the medication and she was able to drink with no problems. She has been ambulatory here without difficulty. I have instructed her to follow-up with PCP regarding these symptoms and provided her with short  course of meclizine to use as needed at home. Extensively reviewed return precautions including any new neurologic symptoms or strokelike symptoms. The patient and family voiced understanding and she was discharged in satisfactory condition.  Final Clinical Impressions(s) / ED Diagnoses   Final diagnoses:  Dizziness  Non-intractable vomiting with nausea, unspecified vomiting type     Discharge Medication List as of 07/24/2016  9:20 PM    START taking these medications   Details  meclizine (ANTIVERT) 25 MG tablet Take 1 tablet (25 mg total) by mouth 3 (three) times daily as needed for dizziness., Starting Sun 07/24/2016, Print           Sharlett Iles, MD 07/25/16 [phone]

## 2016-07-24 NOTE — ED Triage Notes (Addendum)
PT C/O DIZZINESS, N/V, AND HTN SINCE THIS MORNING. PT STS SHE TOOK HER BP MEDICATION, BUT HER BP WAS STILL HIGH 173/99., HR 66. PT ACTIVELY VOMITING IN TRIAGE. PT STS, "I'M GETTING WORSE."

## 2016-07-24 NOTE — ED Notes (Signed)
Pt returned from MRI °

## 2016-07-26 LAB — URINE CULTURE: Special Requests: NORMAL

## 2016-09-19 ENCOUNTER — Other Ambulatory Visit (HOSPITAL_COMMUNITY): Payer: Self-pay | Admitting: *Deleted

## 2016-09-20 ENCOUNTER — Ambulatory Visit (HOSPITAL_COMMUNITY)
Admission: RE | Admit: 2016-09-20 | Discharge: 2016-09-20 | Disposition: A | Discharge status: Home / Self Care | Payer: Medicare Other | Source: Ambulatory Visit | Attending: Family Medicine | Admitting: Family Medicine

## 2016-09-20 DIAGNOSIS — M810 Age-related osteoporosis without current pathological fracture: Secondary | ICD-10-CM | POA: Insufficient documentation

## 2016-09-20 MED ORDER — ZOLEDRONIC ACID 5 MG/100ML IV SOLN
INTRAVENOUS | Status: AC
  Administered 2016-09-20: 5 mg via INTRAVENOUS
  Filled 2016-09-20: qty 100

## 2016-09-20 MED ORDER — ZOLEDRONIC ACID 5 MG/100ML IV SOLN
5.0000 mg | Freq: Once | INTRAVENOUS | Status: AC
  Administered 2016-09-20: 5 mg via INTRAVENOUS

## 2016-10-31 ENCOUNTER — Other Ambulatory Visit: Payer: Self-pay | Admitting: Family Medicine

## 2016-10-31 ENCOUNTER — Ambulatory Visit
Admission: RE | Admit: 2016-10-31 | Discharge: 2016-10-31 | Disposition: A | Discharge status: Home / Self Care | Payer: Medicare Other | Source: Ambulatory Visit | Attending: Family Medicine | Admitting: Family Medicine

## 2016-10-31 DIAGNOSIS — M25572 Pain in left ankle and joints of left foot: Principal | ICD-10-CM

## 2016-10-31 DIAGNOSIS — M25472 Effusion, left ankle: Secondary | ICD-10-CM

## 2017-01-12 IMAGING — RF DG THORACIC SPINE 2V
1 series · 1 of 1 positions shown · non-contrast
Comparison: Chest x-ray 06/17/2015

CLINICAL DATA: T9 kyphoplasty

EXAM:
THORACIC SPINE 2 VIEWS ; DG C-ARM 61-120 MIN

[Series 1: run · 1 of 1 slices shown]
[im 1/1]
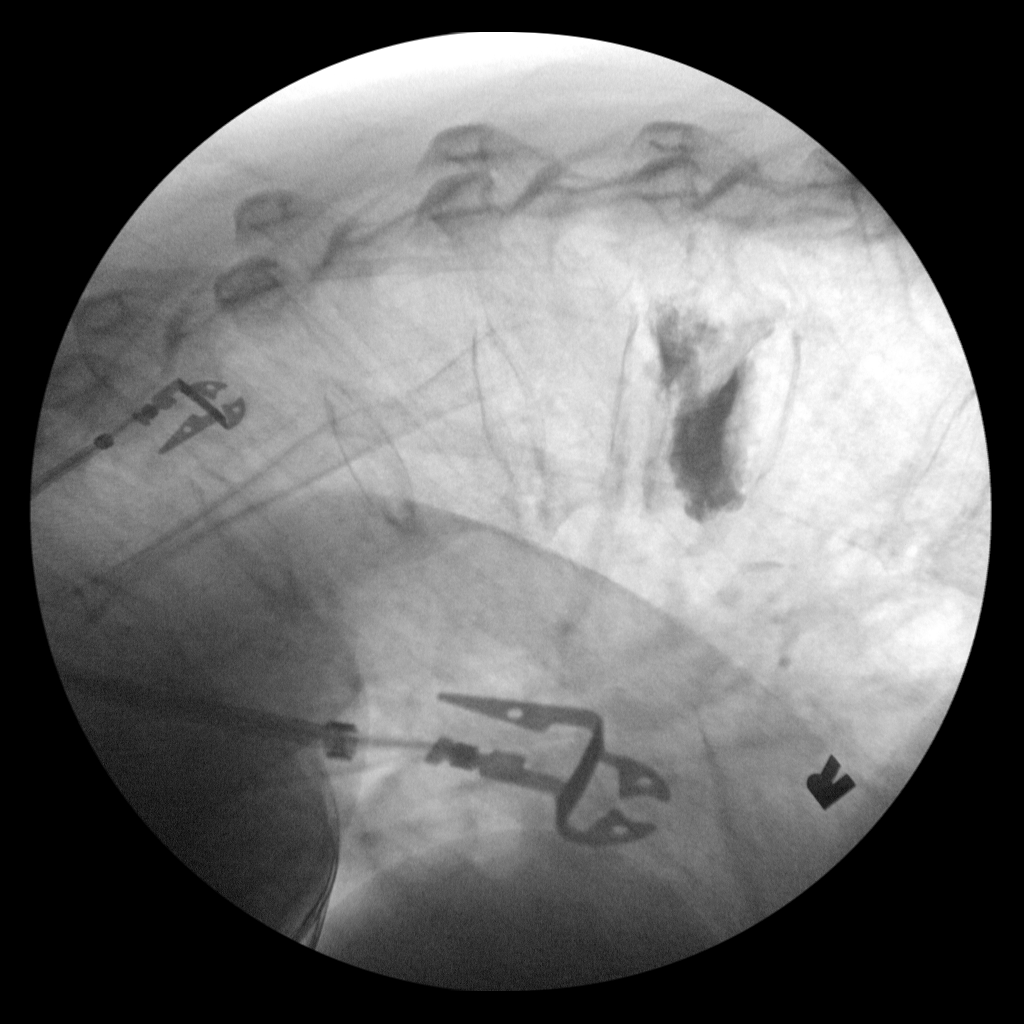

[1 of 1 positions shown; findings below may reference images not displayed]

FINDINGS: Two views of thoracic spine submitted. There is kyphoplasty
presumably at T9 level. Stable compression deformity of T8 vertebral
body. The alignment is preserved.
IMPRESSION: Kyphoplasty presumably at T9 level.  Alignment is preserved.

Fluoroscopy time was 1 minutes 55 seconds. Please see the operative
report.

## 2017-10-04 ENCOUNTER — Other Ambulatory Visit (HOSPITAL_COMMUNITY): Payer: Self-pay | Admitting: *Deleted

## 2017-10-05 ENCOUNTER — Ambulatory Visit (HOSPITAL_COMMUNITY)
Admission: RE | Admit: 2017-10-05 | Discharge: 2017-10-05 | Disposition: A | Discharge status: Home / Self Care | Payer: Medicare Other | Source: Ambulatory Visit | Attending: Family Medicine | Admitting: Family Medicine

## 2017-10-05 DIAGNOSIS — M810 Age-related osteoporosis without current pathological fracture: Secondary | ICD-10-CM | POA: Insufficient documentation

## 2017-10-05 MED ORDER — ZOLEDRONIC ACID 5 MG/100ML IV SOLN
INTRAVENOUS | Status: AC
  Administered 2017-10-05: 5 mg
  Filled 2017-10-05: qty 100

## 2017-10-05 MED ORDER — ZOLEDRONIC ACID 5 MG/100ML IV SOLN: 5.0000 mg | Freq: Once | INTRAVENOUS | Status: DC

## 2017-10-11 ENCOUNTER — Other Ambulatory Visit: Payer: Self-pay | Admitting: Family Medicine

## 2017-10-11 DIAGNOSIS — R1032 Left lower quadrant pain: Secondary | ICD-10-CM

## 2017-10-12 ENCOUNTER — Ambulatory Visit
Admission: RE | Admit: 2017-10-12 | Discharge: 2017-10-12 | Disposition: A | Discharge status: Home / Self Care | Payer: Medicare Other | Source: Ambulatory Visit | Attending: Family Medicine | Admitting: Family Medicine

## 2017-10-12 DIAGNOSIS — R1032 Left lower quadrant pain: Secondary | ICD-10-CM

## 2017-10-12 MED ORDER — IOPAMIDOL (ISOVUE-300) INJECTION 61%
100.0000 mL | Freq: Once | INTRAVENOUS | Status: AC | PRN
  Administered 2017-10-12: 100 mL via INTRAVENOUS

## 2018-02-18 IMAGING — CT CT HEAD W/O CM
3 of 4 series · 15 of 47 positions shown, 18 images · non-contrast
Comparison: 05/17/2015

CLINICAL DATA: Dizziness, hypertension since this morning

EXAM:
CT HEAD WITHOUT CONTRAST
TECHNIQUE: Contiguous axial images were obtained from the base of the skull
through the vertex without intravenous contrast.

[Series 2: head w/o · axial · non-contrast · 0.45mm/px · z∈[-111,+39]mm · 9 of 36 slices shown, 12 images]
[im 3/36  brain]
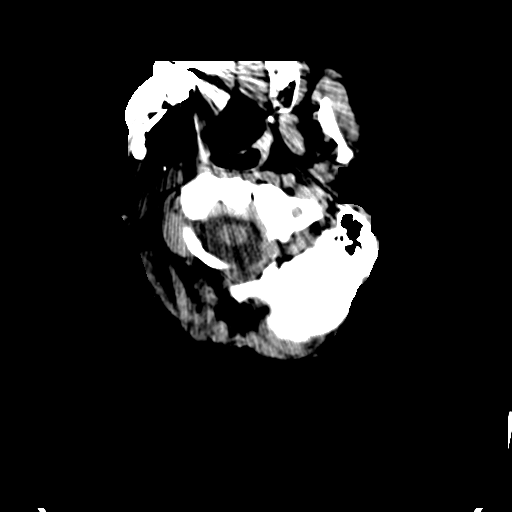
[im 3/36  bone]
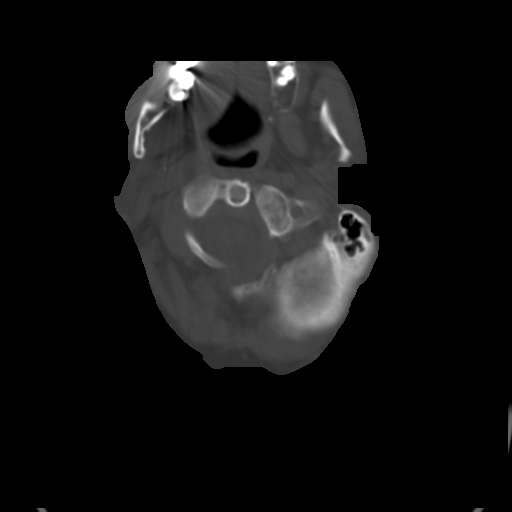
[im 8/36  brain]
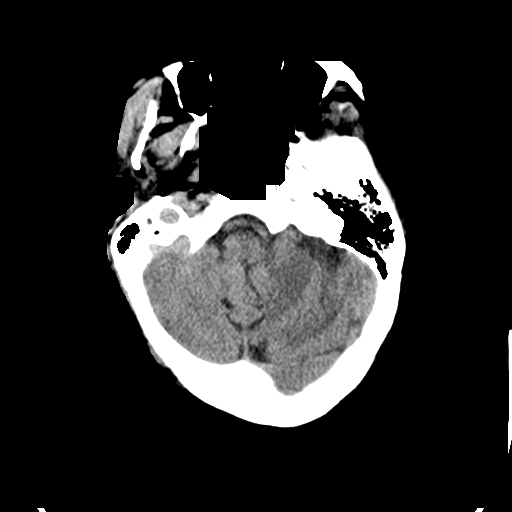
[im 11/36  brain]
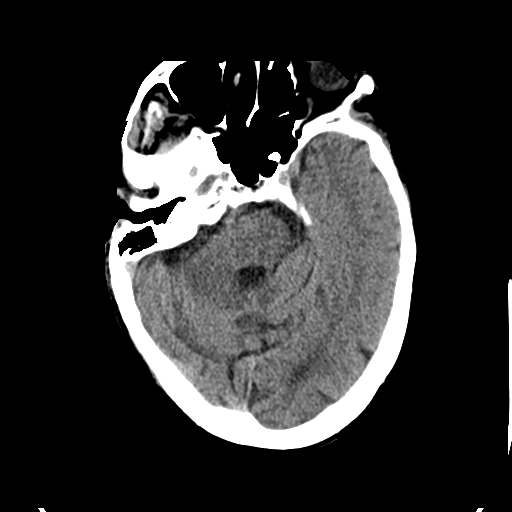
[im 16/36  brain]
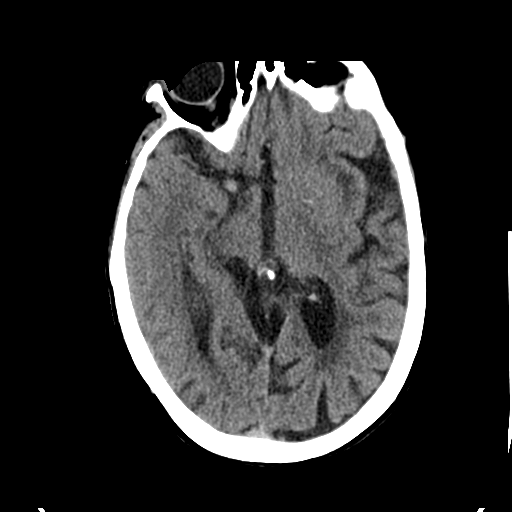
[im 18/36  brain]
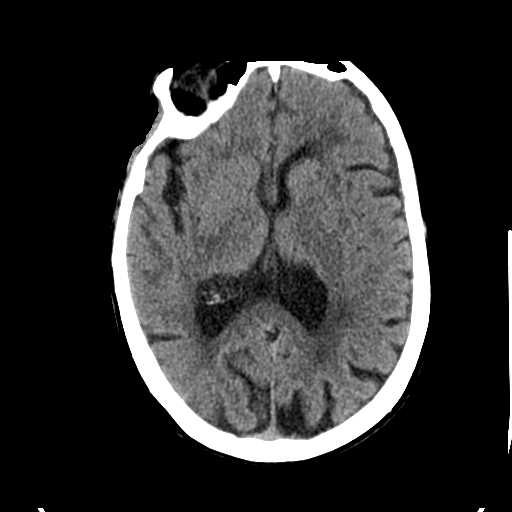
[im 18/36  bone]
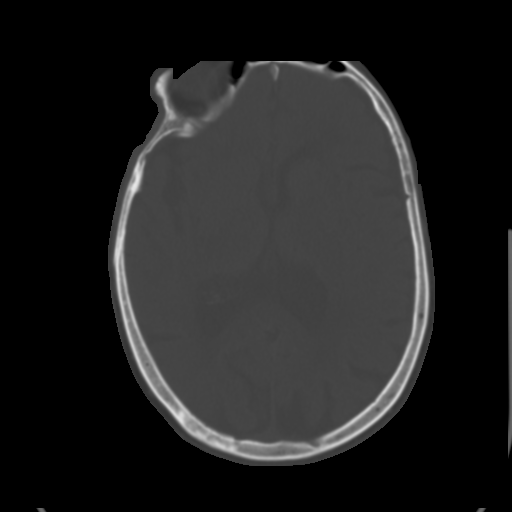
[im 21/36  brain]
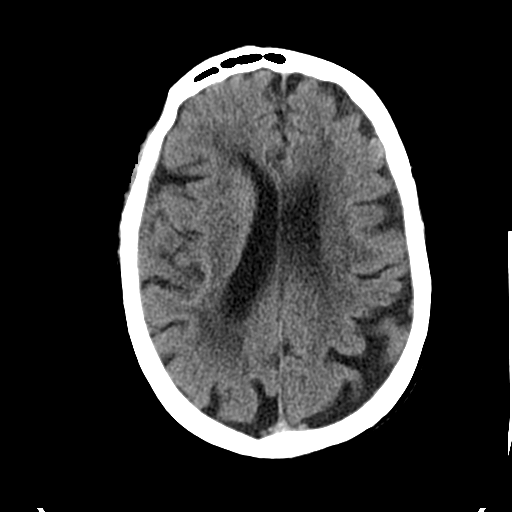
[im 26/36  brain]
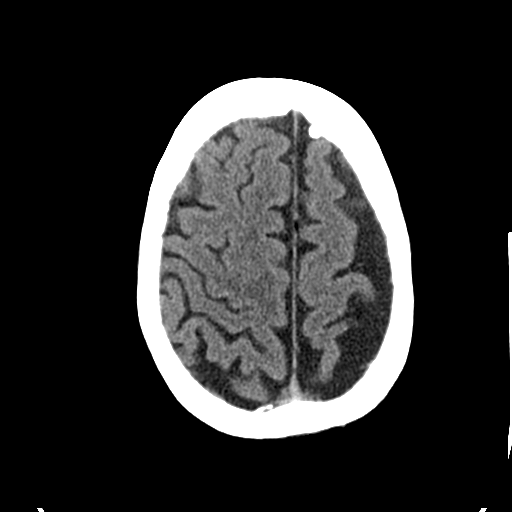
[im 28/36  brain]
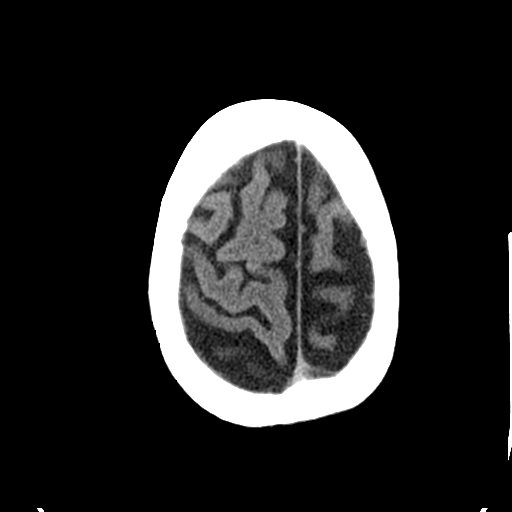
[im 33/36  brain]
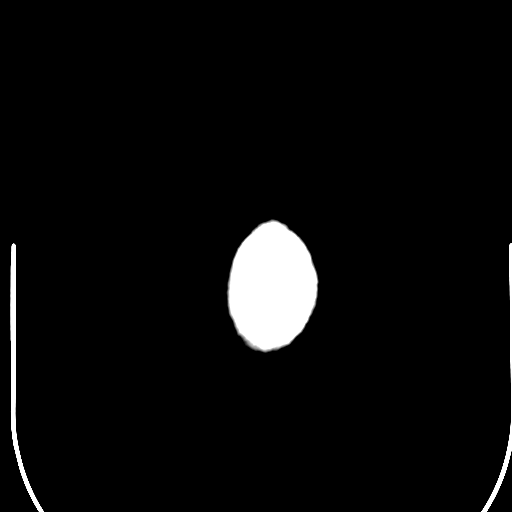
[im 33/36  bone]
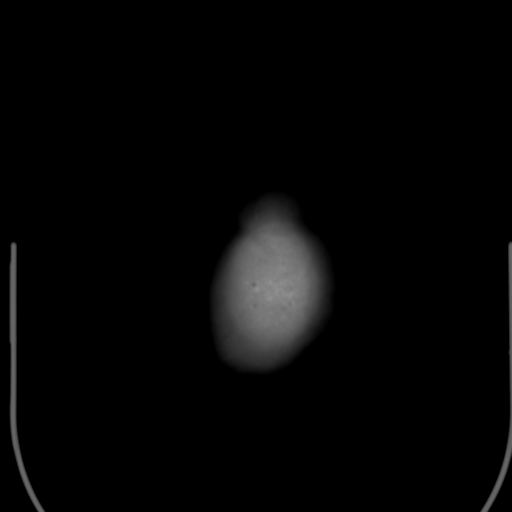

[Series 4: coronal · coronal · 0.35mm/px · 3 of 77 slices shown]
[im 26/77  brain]
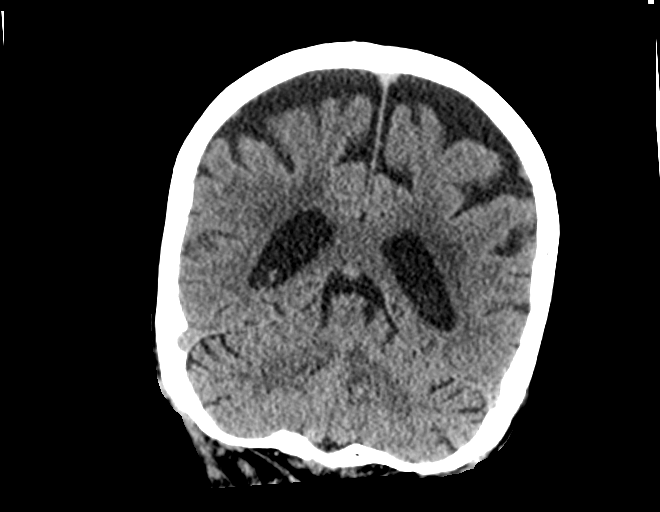
[im 34/77  brain]
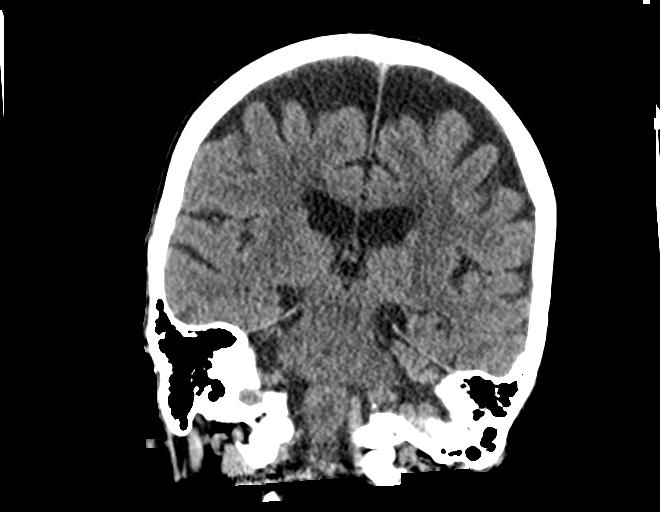
[im 43/77  brain]
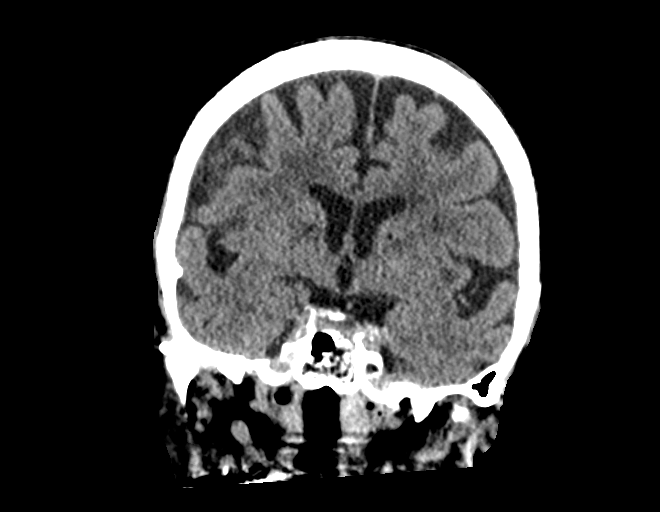

[Series 5: sagittal · sagittal · 0.35mm/px · 3 of 67 slices shown]
[im 25/67  brain]
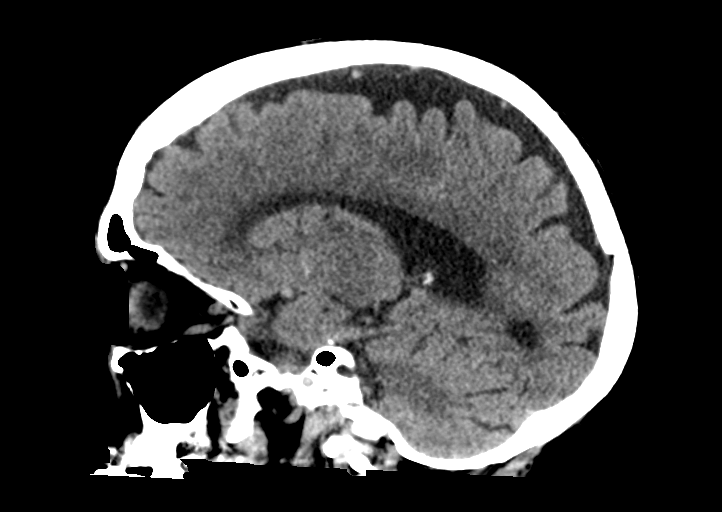
[im 34/67  brain]
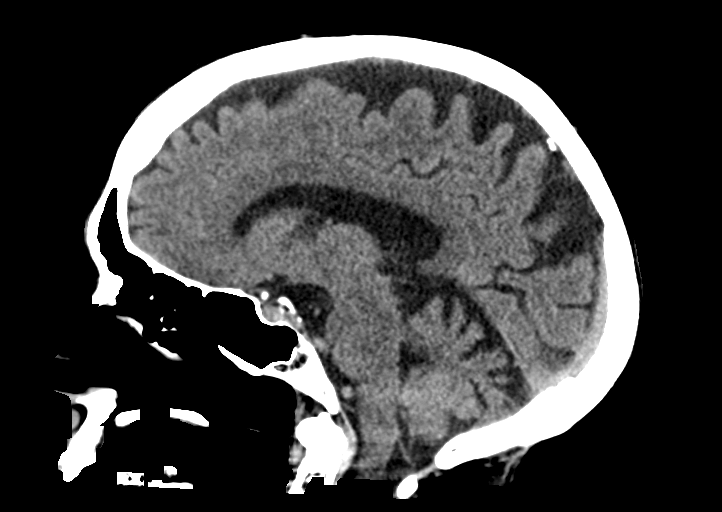
[im 43/67  brain]
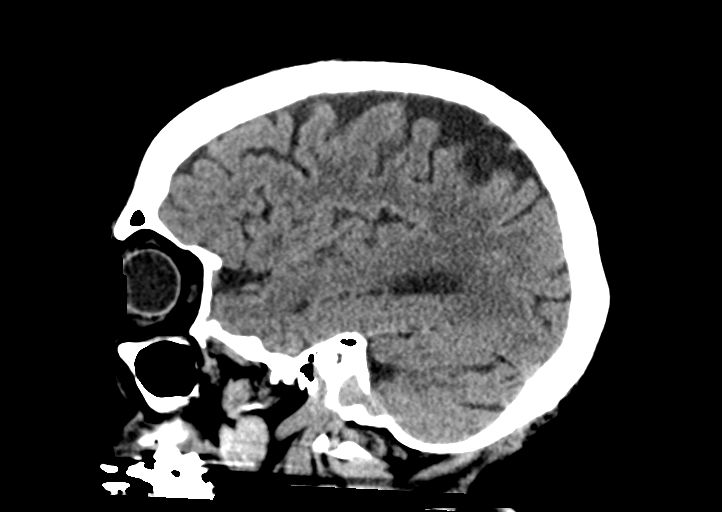

[15 of 47 positions shown; findings below may reference images not displayed]

FINDINGS: Brain: No evidence of acute infarction, hemorrhage, extra-axial
collection, ventriculomegaly, or mass effect. Generalized cerebral
atrophy. Periventricular white matter low attenuation likely
secondary to microangiopathy.

Vascular: Cerebrovascular atherosclerotic calcifications are noted.

Skull: Negative for fracture or focal lesion.

Sinuses/Orbits: Visualized portions of the orbits are unremarkable.
Visualized portions of the paranasal sinuses and mastoid air cells
are unremarkable.

Other: None.
IMPRESSION: 1. No acute intracranial pathology.
2. Chronic microvascular disease and cerebral atrophy.

## 2018-03-19 ENCOUNTER — Other Ambulatory Visit: Payer: Self-pay | Admitting: Family Medicine

## 2018-03-19 ENCOUNTER — Ambulatory Visit
Admission: RE | Admit: 2018-03-19 | Discharge: 2018-03-19 | Disposition: A | Discharge status: Home / Self Care | Payer: Medicare Other | Source: Ambulatory Visit | Attending: Family Medicine | Admitting: Family Medicine

## 2018-03-19 ENCOUNTER — Other Ambulatory Visit: Payer: Self-pay

## 2018-03-19 DIAGNOSIS — M546 Pain in thoracic spine: Secondary | ICD-10-CM

## 2018-12-16 ENCOUNTER — Other Ambulatory Visit: Payer: Self-pay

## 2018-12-16 ENCOUNTER — Emergency Department (HOSPITAL_COMMUNITY): Payer: Medicare Other

## 2018-12-16 ENCOUNTER — Emergency Department (HOSPITAL_COMMUNITY)
Admission: EM | Admit: 2018-12-16 | Discharge: 2018-12-16 | Disposition: A | Discharge status: Home / Self Care | Payer: Medicare Other | Attending: Emergency Medicine | Admitting: Emergency Medicine

## 2018-12-16 ENCOUNTER — Encounter (HOSPITAL_COMMUNITY): Payer: Self-pay | Admitting: Emergency Medicine

## 2018-12-16 DIAGNOSIS — R079 Chest pain, unspecified: Secondary | ICD-10-CM | POA: Diagnosis present

## 2018-12-16 DIAGNOSIS — R0789 Other chest pain: Secondary | ICD-10-CM

## 2018-12-16 DIAGNOSIS — Z79899 Other long term (current) drug therapy: Secondary | ICD-10-CM | POA: Insufficient documentation

## 2018-12-16 DIAGNOSIS — Z7982 Long term (current) use of aspirin: Secondary | ICD-10-CM | POA: Insufficient documentation

## 2018-12-16 DIAGNOSIS — Z85828 Personal history of other malignant neoplasm of skin: Secondary | ICD-10-CM | POA: Insufficient documentation

## 2018-12-16 DIAGNOSIS — Z20828 Contact with and (suspected) exposure to other viral communicable diseases: Secondary | ICD-10-CM | POA: Insufficient documentation

## 2018-12-16 LAB — CBC
HCT: 46.2 % — ABNORMAL HIGH (ref 36.0–46.0)
Hemoglobin: 14.7 g/dL (ref 12.0–15.0)
MCH: 29.1 pg (ref 26.0–34.0)
MCHC: 31.8 g/dL (ref 30.0–36.0)
MCV: 91.3 fL (ref 80.0–100.0)
Platelets: 277 10*3/uL (ref 150–400)
RBC: 5.06 MIL/uL (ref 3.87–5.11)
RDW: 12.7 % (ref 11.5–15.5)
WBC: 9.9 10*3/uL (ref 4.0–10.5)
nRBC: 0 % (ref 0.0–0.2)

## 2018-12-16 LAB — BASIC METABOLIC PANEL
Anion gap: 11 (ref 5–15)
BUN: 20 mg/dL (ref 8–23)
CO2: 27 mmol/L (ref 22–32)
Calcium: 9.7 mg/dL (ref 8.9–10.3)
Chloride: 103 mmol/L (ref 98–111)
Creatinine, Ser: 0.85 mg/dL (ref 0.44–1.00)
GFR calc Af Amer: >60 mL/min (ref >60)
GFR calc non Af Amer: >60 mL/min (ref >60)
Glucose, Bld: 133 mg/dL — ABNORMAL HIGH (ref 70–99)
Potassium: 4.1 mmol/L (ref 3.5–5.1)
Sodium: 141 mmol/L (ref 135–145)

## 2018-12-16 LAB — TROPONIN I (HIGH SENSITIVITY)
Troponin I (High Sensitivity): <2 ng/L (ref <18)
Troponin I (High Sensitivity): 3 ng/L (ref <18)

## 2018-12-16 MED ORDER — DICLOFENAC SODIUM 1 % TD GEL
4.0000 g | Freq: Once | TRANSDERMAL | Status: AC
  Administered 2018-12-16: 4 g via TOPICAL
  Filled 2018-12-16: qty 100

## 2018-12-16 MED ORDER — ACETAMINOPHEN 500 MG PO TABS
1000.0000 mg | ORAL_TABLET | Freq: Once | ORAL | Status: AC
  Administered 2018-12-16: 09:00:00 1000 mg via ORAL
  Filled 2018-12-16: qty 2

## 2018-12-16 MED ORDER — SODIUM CHLORIDE 0.9% FLUSH
3.0000 mL | Freq: Once | INTRAVENOUS | Status: AC
  Administered 2018-12-16: 3 mL via INTRAVENOUS

## 2018-12-16 MED ORDER — ALUM & MAG HYDROXIDE-SIMETH 200-200-20 MG/5ML PO SUSP
30.0000 mL | Freq: Once | ORAL | Status: AC
  Administered 2018-12-16: 30 mL via ORAL
  Filled 2018-12-16: qty 30

## 2018-12-16 MED ORDER — DICLOFENAC SODIUM 1 % TD GEL: 4.0000 g | Freq: Four times a day (QID) | TRANSDERMAL | 0 refills | Status: DC

## 2018-12-16 NOTE — ED Notes (Signed)
Pt verbalized understanding of discharge paperwork and follow-up care. Son to pick up pt

## 2018-12-16 NOTE — ED Provider Notes (Addendum)
[facility] EMERGENCY DEPARTMENT Provider Note   CSN: [id number] Arrival date & time: 12/16/18  0636    History   Chief Complaint Chief Complaint  Patient presents with   Chest Pain    HPI Yolanda Phillips is a 85 y.o. female.     85 yo F with a chief complaint of chest pain.  This started yesterday evening.  Described it as a burning and a fullness to her chest.  No radiation some shortness of breath with it.  Improved with some Tums.  Woke up this morning with recurrence of her pain about 2 hours ago.  Feels a little bit better now but still has some symptoms.  No history of MI has a history of hypertension.  Denies hyperlipidemia diabetes smoking or family history.  Denies history of PE or DVT.  Denies hemoptysis denies recent surgery or immobilization, denies estrogen use.  Denies history of cancer.  She does feel that her left leg is more swollen than the right sometimes, has been going on for 3 years.  Not currently swollen.  The history is provided by the patient.  Chest Pain Pain location:  Epigastric Pain quality: aching, burning and pressure   Pain radiates to:  Does not radiate Pain severity:  Mild Onset quality:  Gradual Duration:  24 hours Timing:  Constant Progression:  Worsening Chronicity:  New Relieved by:  Nothing Worsened by:  Nothing Ineffective treatments:  None tried Associated symptoms: no dizziness, no fever, no headache, no nausea, no palpitations, no shortness of breath and no vomiting     Past Medical History:  Diagnosis Date   Arthritis    Cancer ([place])    removed skin cancer from her arm   Chronic back pain    Costochondritis    Diverticulosis    Diverticulosis    GERD (gastroesophageal reflux disease)    Hypercholesterolemia    IBS (irritable bowel syndrome)    Kidney stone    Osteoporosis    PONV (postoperative nausea and vomiting)     Patient Active Problem List   Diagnosis Date Noted   Atypical  chest pain 03/29/2013   Chest pain 03/26/2013   GERD (gastroesophageal reflux disease)    Osteoporosis    Diverticulosis    PONV (postoperative nausea and vomiting)    Costochondritis    IBS (irritable bowel syndrome)     Past Surgical History:  Procedure Laterality Date   ABDOMINAL HYSTERECTOMY     BREAST SURGERY Bilateral    implants   CHOLECYSTECTOMY     COLONOSCOPY     EYE SURGERY     bilateral cataracts   FACIAL COSMETIC SURGERY     20 + years ago   KYPHOPLASTY N/A 06/18/2015   Procedure: KYPHOPLASTY;  Surgeon: Phylliss Bob, MD;  Location: [facility];  Service: Orthopedics;  Laterality: N/A;  Thoracic 9 kyphoplasty   MELANOMA EXCISION Right 01/14/2013   Procedure: WIDE LOCAL EXCISION RIGHT WRIST MELANOMA ;  Surgeon: Madilyn [facility], DO;  Location: [facility];  Service: General;  Laterality: Right;   TONSILLECTOMY       OB History   No obstetric history on file.      Home Medications    Prior to Admission medications   Medication Sig Start Date End Date Taking? Authorizing Provider  acetaminophen (TYLENOL) 500 MG tablet Take 1,000 mg by mouth every 6 (six) hours as needed.    [provider]  aspirin 325 MG tablet Take 325 mg by  mouth once.    [provider]  calcium-vitamin D (OSCAL WITH D) 500-200 MG-UNIT per tablet Take 1 tablet by mouth 2 (two) times daily.    [provider]  COD LIVER OIL/VITAMINS A & D CAPS Take by mouth. 415 mg cod liver oil +A and D    [provider]  diclofenac sodium (VOLTAREN) 1 % GEL Apply 4 g topically 4 (four) times daily. 12/16/18   Deno Etienne, DO  magnesium oxide (MAG-OX) 400 MG tablet Take 400 mg by mouth daily.    [provider]  meclizine (ANTIVERT) 25 MG tablet Take 1 tablet (25 mg total) by mouth 3 (three) times daily as needed for dizziness. 07/24/16   Little, Wenda Overland, MD  metoprolol succinate (TOPROL-XL) 50 MG 24 hr tablet Take 1 tablet by mouth daily. Blood pressure 07/13/16    [provider]  Multiple Vitamin (MULTIVITAMIN WITH MINERALS) TABS Take 1 tablet by mouth daily. One a Day Women's 50 plus    [provider]  pantoprazole (PROTONIX) 40 MG tablet Take 40 mg by mouth daily. 10/20/15   [provider]  zoledronic acid (RECLAST) 5 MG/100ML SOLN injection Inject 5 mg into the vein. Yearly    [provider]    Family History Family History  Problem Relation Age of Onset   Multiple myeloma Mother    Hypertension Father     Social History Social History   Tobacco Use   Smoking status: Never Smoker   Smokeless tobacco: Never Used  Substance Use Topics   Alcohol use: No   Drug use: No     Allergies   Fosamax [alendronate sodium], Boniva [ibandronic acid], Evista [raloxifene], Levbid [hyoscyamine], Nexium [esomeprazole magnesium], Norco [hydrocodone-acetaminophen], Prevacid [lansoprazole], and Morphine and related   Review of Systems Review of Systems  Constitutional: Negative for chills and fever.  HENT: Negative for congestion and rhinorrhea.   Eyes: Negative for redness and visual disturbance.  Respiratory: Negative for shortness of breath and wheezing.   Cardiovascular: Positive for chest pain. Negative for palpitations.  Gastrointestinal: Negative for nausea and vomiting.  Genitourinary: Negative for dysuria and urgency.  Musculoskeletal: Negative for arthralgias and myalgias.  Skin: Negative for pallor and wound.  Neurological: Negative for dizziness and headaches.     Physical Exam Updated Vital Signs BP (!) 145/70    Pulse 73    Temp 97.9 F (36.6 C) (Oral)    Resp 19    Ht 5' 4"  (1.626 m)    Wt 57.6 kg    SpO2 96%    BMI 21.80 kg/m   Physical Exam Vitals signs and nursing note reviewed.  Constitutional:      General: She is not in acute distress.    Appearance: She is well-developed. She is not diaphoretic.  HENT:     Head: Normocephalic and atraumatic.  Eyes:     Pupils: Pupils are  equal, round, and reactive to light.  Neck:     Musculoskeletal: Normal range of motion and neck supple.  Cardiovascular:     Rate and Rhythm: Normal rate and regular rhythm.     Heart sounds: No murmur. No friction rub. No gallop.   Pulmonary:     Effort: Pulmonary effort is normal.     Breath sounds: No wheezing or rales.  Chest:     Chest wall: Tenderness present.     Comments: Tenderness along the sternum reproduces the patient's symptoms. Abdominal:     General: There is  no distension.     Palpations: Abdomen is soft.     Tenderness: There is no abdominal tenderness.  Musculoskeletal:        General: No tenderness.  Skin:    General: Skin is warm and dry.  Neurological:     Mental Status: She is alert and oriented to person, place, and time.  Psychiatric:        Behavior: Behavior normal.      ED Treatments / Results  Labs (all labs ordered are listed, but only abnormal results are displayed) Labs Reviewed  BASIC METABOLIC PANEL - Abnormal; Notable for the following components:      Result Value   Glucose, Bld 133 (*)    All other components within normal limits  CBC - Abnormal; Notable for the following components:   HCT 46.2 (*)    All other components within normal limits  TROPONIN I (HIGH SENSITIVITY)  TROPONIN I (HIGH SENSITIVITY)    EKG EKG Interpretation  Date/Time:  Sunday December 16 2018 06:43:14 EDT Ventricular Rate:  68 PR Interval:    QRS Duration: 84 QT Interval:  403 QTC Calculation: 429 R Axis:   -2 Text Interpretation:  Sinus rhythm Abnormal R-wave progression, early transition LVH with secondary repolarization abnormality No significant change since last tracing Confirmed by Christionna Poland ([id number]) on 12/16/2018 7:22:08 AM   Radiology Dg Chest 2 View  Result Date: 12/16/2018 CLINICAL DATA:  Substernal chest pain. EXAM: CHEST - 2 VIEW COMPARISON:  Chest x-ray dated October 03, 2015. FINDINGS: The heart size and mediastinal contours are within normal  limits. Atherosclerotic calcification of the aortic arch. Normal pulmonary vascularity. Low lung volumes. No focal consolidation, pleural effusion, or pneumothorax. No acute osseous abnormality. Unchanged multiple thoracic compression deformities and kyphoscoliosis. Old healed sternal fracture. IMPRESSION: No active cardiopulmonary disease. Electronically Signed   By: William T Derry M.D.   On: 12/16/2018 07:50    Procedures Procedures (including critical care time)  Medications Ordered in ED Medications  sodium chloride flush (NS) 0.9 % injection 3 mL (3 mLs Intravenous Given 12/16/18 0700)  diclofenac sodium (VOLTAREN) 1 % transdermal gel 4 g (4 g Topical Given 12/16/18 0832)  acetaminophen (TYLENOL) tablet 1,000 mg (1,000 mg Oral Given 12/16/18 0832)  alum & mag hydroxide-simeth (MAALOX/MYLANTA) 200-200-20 MG/5ML suspension 30 mL (30 mLs Oral Given 12/16/18 0832)     Initial Impression / Assessment and Plan / ED Course  I have reviewed the triage vital signs and the nursing notes.  Pertinent labs & imaging results that were available during my care of the patient were reviewed by me and considered in my medical decision making (see chart for details).        85  yo F with a chief complaint of chest pain.  This is atypical in nature and reproduced on palpation.  Her symptoms started less than 2 hours ago, will obtain a delta troponin.  Delta troponin negative.  Chest x-ray without pneumothorax or pneumonia. Patient with some improvement with GI cocktail as well as Voltaren gel.  PCP follow-up.  The patient is concerned that her symptoms may be related to the novel coronavirus.  She has had a mild sore throat but denies any other infectious symptoms denies cough denies significant shortness of breath.  I will send an outpatient test. KADE RICKELS was evaluated in Emergency Department on 12/16/2018 for the symptoms described in the history of present illness. He/she was evaluated in the context  of the global COVID-19 pandemic,  which necessitated consideration that the patient might be at risk for infection with the SARS-CoV-2 virus that causes COVID-19. Institutional protocols and algorithms that pertain to the evaluation of patients at risk for COVID-19 are in a state of rapid change based on information released by regulatory bodies including the CDC and federal and state organizations. These policies and algorithms were followed during the patient's care in the ED.   9:45 AM:  I have discussed the diagnosis/risks/treatment options with the patient and believe the pt to be eligible for discharge home to follow-up with PCP. We also discussed returning to the ED immediately if new or worsening sx occur. We discussed the sx which are most concerning (e.g., sudden worsening pain, fever, inability to tolerate by mouth) that necessitate immediate return. Medications administered to the patient during their visit and any new prescriptions provided to the patient are listed below.  Medications given during this visit Medications  sodium chloride flush (NS) 0.9 % injection 3 mL (3 mLs Intravenous Given 12/16/18 0700)  diclofenac sodium (VOLTAREN) 1 % transdermal gel 4 g (4 g Topical Given 12/16/18 0832)  acetaminophen (TYLENOL) tablet 1,000 mg (1,000 mg Oral Given 12/16/18 0832)  alum & mag hydroxide-simeth (MAALOX/MYLANTA) 200-200-20 MG/5ML suspension 30 mL (30 mLs Oral Given 12/16/18 4709)     The patient appears reasonably screen and/or stabilized for discharge and I doubt any other medical condition or other [facility] requiring further screening, evaluation, or treatment in the ED at this time prior to discharge.    Final Clinical Impressions(s) / ED Diagnoses   Final diagnoses:  Atypical chest pain    ED Discharge Orders         Ordered    diclofenac sodium (VOLTAREN) 1 % GEL  4 times daily     12/16/18 Gildford, Liddy Deam, DO 12/16/18 Cunningham, Alyla Pietila, DO 12/16/18 1024

## 2018-12-16 NOTE — ED Triage Notes (Signed)
Per EMS, pt from home woke up w/ worsening substernal chest pain.  Initially started around 9pm yesterday after she ate, she believed the pain was heartburn but Tums gave her no relief.  Pt lives alone, took 325 ASA before EMS got there.  They gave 1 nitro, and the pain decreased "somewhat", blood pressure dropped 30 points to 148/96

## 2018-12-16 NOTE — Discharge Instructions (Signed)
Please call your family doctor and discuss your visit here.  They may wish to do further work-up or send you to a cardiologist.  I think your pain is most likely muscular as it is reproduced when I palpate your chest wall.  You can use the gel as prescribed and Tylenol.  Please return for worsening pain or exertional symptoms.Yolanda Phillips

## 2018-12-17 LAB — NOVEL CORONAVIRUS, NAA (HOSP ORDER, SEND-OUT TO REF LAB; TAT 18-24 HRS): SARS-CoV-2, NAA: NOT DETECTED

## 2019-07-11 ENCOUNTER — Ambulatory Visit: Payer: Medicare Other

## 2019-07-20 ENCOUNTER — Ambulatory Visit: Payer: Medicare Other | Attending: Internal Medicine

## 2019-07-20 DIAGNOSIS — Z23 Encounter for immunization: Secondary | ICD-10-CM | POA: Insufficient documentation

## 2019-07-20 NOTE — Progress Notes (Signed)
   Covid-19 Vaccination Clinic  Name:  Yolanda Phillips    MRN: [id number] DOB: [birth year]  07/20/2019  [name] was observed post Covid-19 immunization for 15 minutes without incidence. She was provided with Vaccine Information Sheet and instruction to access the V-Safe system.   [name] was instructed to call 911 with any severe reactions post vaccine: Marland Kitchen Difficulty breathing  . Swelling of your face and throat  . A fast heartbeat  . A bad rash all over your body  . Dizziness and weakness    Immunizations Administered    Name Date Dose VIS Date Route   Pfizer COVID-19 Vaccine 07/20/2019 12:12 PM 0.3 mL 05/24/2019 Intramuscular   Manufacturer: [place]   Lot: [id number]   [place]: [id number]

## 2019-07-28 ENCOUNTER — Ambulatory Visit: Payer: Medicare Other

## 2019-08-14 ENCOUNTER — Ambulatory Visit: Payer: Medicare Other | Attending: Internal Medicine

## 2019-08-14 DIAGNOSIS — Z23 Encounter for immunization: Secondary | ICD-10-CM

## 2019-08-14 NOTE — Progress Notes (Signed)
   Covid-19 Vaccination Clinic  Name:  Yolanda Phillips    MRN: [id number] DOB: 06/22/1930  08/14/2019  [name] was observed post Covid-19 immunization for 15 minutes without incident. She was provided with Vaccine Information Sheet and instruction to access the V-Safe system.   [name] was instructed to call 911 with any severe reactions post vaccine: Marland Kitchen Difficulty breathing  . Swelling of face and throat  . A fast heartbeat  . A bad rash all over body  . Dizziness and weakness   Immunizations Administered    Name Date Dose VIS Date Route   Pfizer COVID-19 Vaccine 08/14/2019 12:08 PM 0.3 mL 05/24/2019 Intramuscular   Manufacturer: [place]   Lot: [id number]   [place]: [id number]

## 2020-08-17 DIAGNOSIS — S025XXA Fracture of tooth (traumatic), initial encounter for closed fracture: Secondary | ICD-10-CM | POA: Diagnosis not present

## 2020-08-17 DIAGNOSIS — W1830XA Fall on same level, unspecified, initial encounter: Secondary | ICD-10-CM | POA: Diagnosis not present

## 2020-08-19 DIAGNOSIS — R0781 Pleurodynia: Secondary | ICD-10-CM | POA: Diagnosis not present

## 2020-08-19 DIAGNOSIS — S0083XA Contusion of other part of head, initial encounter: Secondary | ICD-10-CM | POA: Diagnosis not present

## 2020-09-23 DIAGNOSIS — S025XXA Fracture of tooth (traumatic), initial encounter for closed fracture: Secondary | ICD-10-CM | POA: Diagnosis not present

## 2020-09-23 DIAGNOSIS — W1830XA Fall on same level, unspecified, initial encounter: Secondary | ICD-10-CM | POA: Diagnosis not present

## 2020-10-01 DIAGNOSIS — M810 Age-related osteoporosis without current pathological fracture: Secondary | ICD-10-CM | POA: Diagnosis not present

## 2020-10-21 DIAGNOSIS — M810 Age-related osteoporosis without current pathological fracture: Secondary | ICD-10-CM | POA: Diagnosis not present

## 2020-10-21 DIAGNOSIS — I10 Essential (primary) hypertension: Secondary | ICD-10-CM | POA: Diagnosis not present

## 2020-10-21 DIAGNOSIS — K589 Irritable bowel syndrome without diarrhea: Secondary | ICD-10-CM | POA: Diagnosis not present

## 2020-10-21 DIAGNOSIS — I700 Atherosclerosis of aorta: Secondary | ICD-10-CM | POA: Diagnosis not present

## 2020-10-21 DIAGNOSIS — M2550 Pain in unspecified joint: Secondary | ICD-10-CM | POA: Diagnosis not present

## 2020-10-21 DIAGNOSIS — K219 Gastro-esophageal reflux disease without esophagitis: Secondary | ICD-10-CM | POA: Diagnosis not present

## 2020-10-21 DIAGNOSIS — E7800 Pure hypercholesterolemia, unspecified: Secondary | ICD-10-CM | POA: Diagnosis not present

## 2020-10-21 DIAGNOSIS — R7301 Impaired fasting glucose: Secondary | ICD-10-CM | POA: Diagnosis not present

## 2020-12-15 DIAGNOSIS — M1990 Unspecified osteoarthritis, unspecified site: Secondary | ICD-10-CM | POA: Diagnosis not present

## 2020-12-15 DIAGNOSIS — M810 Age-related osteoporosis without current pathological fracture: Secondary | ICD-10-CM | POA: Diagnosis not present

## 2020-12-15 DIAGNOSIS — Z79899 Other long term (current) drug therapy: Secondary | ICD-10-CM | POA: Diagnosis not present

## 2020-12-15 DIAGNOSIS — M549 Dorsalgia, unspecified: Secondary | ICD-10-CM | POA: Diagnosis not present

## 2021-02-23 DIAGNOSIS — K921 Melena: Secondary | ICD-10-CM | POA: Diagnosis not present

## 2021-02-23 DIAGNOSIS — R109 Unspecified abdominal pain: Secondary | ICD-10-CM | POA: Diagnosis not present

## 2021-03-10 DIAGNOSIS — Z1231 Encounter for screening mammogram for malignant neoplasm of breast: Secondary | ICD-10-CM | POA: Diagnosis not present

## 2021-03-27 DIAGNOSIS — Z23 Encounter for immunization: Secondary | ICD-10-CM | POA: Diagnosis not present

## 2021-04-01 DIAGNOSIS — H53481 Generalized contraction of visual field, right eye: Secondary | ICD-10-CM | POA: Diagnosis not present

## 2021-04-01 DIAGNOSIS — Z961 Presence of intraocular lens: Secondary | ICD-10-CM | POA: Diagnosis not present

## 2021-04-01 DIAGNOSIS — H47293 Other optic atrophy, bilateral: Secondary | ICD-10-CM | POA: Diagnosis not present

## 2021-04-01 DIAGNOSIS — H26492 Other secondary cataract, left eye: Secondary | ICD-10-CM | POA: Diagnosis not present

## 2021-04-08 DIAGNOSIS — M810 Age-related osteoporosis without current pathological fracture: Secondary | ICD-10-CM | POA: Diagnosis not present

## 2021-04-29 DIAGNOSIS — R7301 Impaired fasting glucose: Secondary | ICD-10-CM | POA: Diagnosis not present

## 2021-04-29 DIAGNOSIS — M810 Age-related osteoporosis without current pathological fracture: Secondary | ICD-10-CM | POA: Diagnosis not present

## 2021-04-29 DIAGNOSIS — K589 Irritable bowel syndrome without diarrhea: Secondary | ICD-10-CM | POA: Diagnosis not present

## 2021-04-29 DIAGNOSIS — I700 Atherosclerosis of aorta: Secondary | ICD-10-CM | POA: Diagnosis not present

## 2021-04-29 DIAGNOSIS — Z0000 Encounter for general adult medical examination without abnormal findings: Secondary | ICD-10-CM | POA: Diagnosis not present

## 2021-04-29 DIAGNOSIS — K219 Gastro-esophageal reflux disease without esophagitis: Secondary | ICD-10-CM | POA: Diagnosis not present

## 2021-04-29 DIAGNOSIS — Z131 Encounter for screening for diabetes mellitus: Secondary | ICD-10-CM | POA: Diagnosis not present

## 2021-04-29 DIAGNOSIS — I10 Essential (primary) hypertension: Secondary | ICD-10-CM | POA: Diagnosis not present

## 2021-04-29 DIAGNOSIS — Z1389 Encounter for screening for other disorder: Secondary | ICD-10-CM | POA: Diagnosis not present

## 2021-04-29 DIAGNOSIS — M2550 Pain in unspecified joint: Secondary | ICD-10-CM | POA: Diagnosis not present

## 2021-04-29 DIAGNOSIS — E7800 Pure hypercholesterolemia, unspecified: Secondary | ICD-10-CM | POA: Diagnosis not present

## 2021-05-10 DIAGNOSIS — M85852 Other specified disorders of bone density and structure, left thigh: Secondary | ICD-10-CM | POA: Diagnosis not present

## 2021-05-10 DIAGNOSIS — Z780 Asymptomatic menopausal state: Secondary | ICD-10-CM | POA: Diagnosis not present

## 2021-05-10 DIAGNOSIS — M810 Age-related osteoporosis without current pathological fracture: Secondary | ICD-10-CM | POA: Diagnosis not present

## 2021-06-17 DIAGNOSIS — M810 Age-related osteoporosis without current pathological fracture: Secondary | ICD-10-CM | POA: Diagnosis not present

## 2021-06-17 DIAGNOSIS — M549 Dorsalgia, unspecified: Secondary | ICD-10-CM | POA: Diagnosis not present

## 2021-06-17 DIAGNOSIS — M25561 Pain in right knee: Secondary | ICD-10-CM | POA: Diagnosis not present

## 2021-06-17 DIAGNOSIS — M25562 Pain in left knee: Secondary | ICD-10-CM | POA: Diagnosis not present

## 2021-06-17 DIAGNOSIS — M1990 Unspecified osteoarthritis, unspecified site: Secondary | ICD-10-CM | POA: Diagnosis not present

## 2021-06-17 DIAGNOSIS — M25569 Pain in unspecified knee: Secondary | ICD-10-CM | POA: Diagnosis not present

## 2021-06-17 DIAGNOSIS — Z79899 Other long term (current) drug therapy: Secondary | ICD-10-CM | POA: Diagnosis not present

## 2021-07-19 DIAGNOSIS — R1084 Generalized abdominal pain: Secondary | ICD-10-CM | POA: Diagnosis not present

## 2021-09-06 DIAGNOSIS — R1032 Left lower quadrant pain: Secondary | ICD-10-CM | POA: Diagnosis not present

## 2021-09-06 DIAGNOSIS — K921 Melena: Secondary | ICD-10-CM | POA: Diagnosis not present

## 2021-10-11 DIAGNOSIS — M810 Age-related osteoporosis without current pathological fracture: Secondary | ICD-10-CM | POA: Diagnosis not present

## 2021-10-22 ENCOUNTER — Other Ambulatory Visit: Payer: Self-pay

## 2021-10-22 ENCOUNTER — Emergency Department (HOSPITAL_BASED_OUTPATIENT_CLINIC_OR_DEPARTMENT_OTHER)
Admission: EM | Admit: 2021-10-22 | Discharge: 2021-10-22 | Disposition: A | Discharge status: Home / Self Care | Payer: Medicare Other | Attending: Emergency Medicine | Admitting: Emergency Medicine

## 2021-10-22 ENCOUNTER — Emergency Department (HOSPITAL_BASED_OUTPATIENT_CLINIC_OR_DEPARTMENT_OTHER): Payer: Medicare Other

## 2021-10-22 ENCOUNTER — Encounter (HOSPITAL_BASED_OUTPATIENT_CLINIC_OR_DEPARTMENT_OTHER): Payer: Self-pay

## 2021-10-22 ENCOUNTER — Emergency Department (HOSPITAL_BASED_OUTPATIENT_CLINIC_OR_DEPARTMENT_OTHER): Payer: Medicare Other | Admitting: Radiology

## 2021-10-22 DIAGNOSIS — S52552A Other extraarticular fracture of lower end of left radius, initial encounter for closed fracture: Secondary | ICD-10-CM | POA: Diagnosis not present

## 2021-10-22 DIAGNOSIS — I10 Essential (primary) hypertension: Secondary | ICD-10-CM | POA: Diagnosis not present

## 2021-10-22 DIAGNOSIS — S6992XA Unspecified injury of left wrist, hand and finger(s), initial encounter: Secondary | ICD-10-CM | POA: Diagnosis present

## 2021-10-22 DIAGNOSIS — S52572A Other intraarticular fracture of lower end of left radius, initial encounter for closed fracture: Secondary | ICD-10-CM | POA: Diagnosis not present

## 2021-10-22 DIAGNOSIS — Z043 Encounter for examination and observation following other accident: Secondary | ICD-10-CM | POA: Diagnosis not present

## 2021-10-22 DIAGNOSIS — Z7982 Long term (current) use of aspirin: Secondary | ICD-10-CM | POA: Insufficient documentation

## 2021-10-22 DIAGNOSIS — W010XXA Fall on same level from slipping, tripping and stumbling without subsequent striking against object, initial encounter: Secondary | ICD-10-CM | POA: Insufficient documentation

## 2021-10-22 DIAGNOSIS — Z79899 Other long term (current) drug therapy: Secondary | ICD-10-CM | POA: Diagnosis not present

## 2021-10-22 DIAGNOSIS — S52352A Displaced comminuted fracture of shaft of radius, left arm, initial encounter for closed fracture: Secondary | ICD-10-CM | POA: Diagnosis not present

## 2021-10-22 MED ORDER — LIDOCAINE HCL 2 % IJ SOLN
20.0000 mL | Freq: Once | INTRAMUSCULAR | Status: AC
  Administered 2021-10-22: 400 mg
  Filled 2021-10-22: qty 20

## 2021-10-22 MED ORDER — IBUPROFEN 400 MG PO TABS
400.0000 mg | ORAL_TABLET | Freq: Once | ORAL | Status: AC | PRN
  Administered 2021-10-22: 400 mg via ORAL
  Filled 2021-10-22: qty 1

## 2021-10-22 NOTE — ED Triage Notes (Signed)
Pt presents with injuries from a fall when she tripped while working in her flower bed. Pt denies head injury or LOC. Pt c/o injury with deformity to the L wrist. Pain to sacral area. Pt has CMS intact distal to injury on LUE.  ?

## 2021-10-22 NOTE — Discharge Instructions (Addendum)
You were seen in the emergency department today after fall. ? ?As we discussed you broke the 2 bones in your wrist.  We talked to the hand surgeon and he wanted [place] to splint it.  I have attached all his contact information for you to call tomorrow morning for an appointment.  I recommend not eating anything tomorrow morning just in case he is able to take you to surgery tomorrow. ? ?You can continue ibuprofen at home as needed for pain. ? ?I would also recommend following up with your primary care doctor about your blood pressure. ? ?Continue to monitor how you're doing and return to the ER for new or worsening symptoms.  ?

## 2021-10-22 NOTE — ED Notes (Signed)
Lorin R. PA aware of pts BP.  ?

## 2021-10-22 NOTE — ED Provider Notes (Signed)
?[facility] EMERGENCY DEPT ?Provider Note ? ? ?CSN: [id number] ?Arrival date & time: 10/22/21  1354 ? ?  ? ?History ? ?Chief Complaint  ?Patient presents with  ? Fall  ? ? ?Yolanda Phillips is a 88 y.o. female who presents emergency department after a fall this afternoon.  Patient states that she was working in her garden, when she tripped and fell onto her backside and braced her fall with her left hand.  She denies any head injury or loss of consciousness.  She is complaining of pain in deformity of her left wrist, as well as pain to her sacral area. ? ? ?Fall ?Pertinent negatives include no headaches.  ? ?  ? ?Home Medications ?Prior to Admission medications   ?Medication Sig Start Date End Date Taking? Authorizing Provider  ?acetaminophen (TYLENOL) 500 MG tablet Take 1,000 mg by mouth every 6 (six) hours as needed.    [provider]  ?aspirin 325 MG tablet Take 325 mg by mouth once.    [provider]  ?calcium-vitamin D (OSCAL WITH D) 500-200 MG-UNIT per tablet Take 1 tablet by mouth 2 (two) times daily.    [provider]  ?COD LIVER OIL/VITAMINS A & D CAPS Take by mouth. 415 mg cod liver oil +A and D    [provider]  ?diclofenac sodium (VOLTAREN) 1 % GEL Apply 4 g topically 4 (four) times daily. 12/16/18   Deno Etienne, DO  ?magnesium oxide (MAG-OX) 400 MG tablet Take 400 mg by mouth daily.    [provider]  ?meclizine (ANTIVERT) 25 MG tablet Take 1 tablet (25 mg total) by mouth 3 (three) times daily as needed for dizziness. 07/24/16   Little, Wenda Overland, MD  ?metoprolol succinate (TOPROL-XL) 50 MG 24 hr tablet Take 1 tablet by mouth daily. Blood pressure 07/13/16   [provider]  ?Multiple Vitamin (MULTIVITAMIN WITH MINERALS) TABS Take 1 tablet by mouth daily. One a Day Women's 50 plus    [provider]  ?pantoprazole (PROTONIX) 40 MG tablet Take 40 mg by mouth daily. 10/20/15   [provider]  ?zoledronic acid (RECLAST)  5 MG/100ML SOLN injection Inject 5 mg into the vein. Yearly    [provider]  ?   ? ?Allergies    ?Fosamax [alendronate sodium], Boniva [ibandronic acid], Evista [raloxifene], Levbid [hyoscyamine], Nexium [esomeprazole magnesium], Norco [hydrocodone-acetaminophen], Prevacid [lansoprazole], and Morphine and related   ? ?Review of Systems   ?Review of Systems  ?Musculoskeletal:   ?     Left wrist and sacral pain  ?Neurological:  Negative for syncope and headaches.  ?All other systems reviewed and are negative. ? ?Physical Exam ?Updated Vital Signs ?BP (!) 182/99 (BP Location: Right Arm)   Pulse 80   Temp 98.4 ?F (36.9 ?C) (Oral)   Resp 16   Ht '4\' 11"'$  (1.499 m)   Wt 57.2 kg   SpO2 95%   BMI 25.45 kg/m?  ?Physical Exam ?Vitals and nursing note reviewed.  ?Constitutional:   ?   Appearance: Normal appearance.  ?HENT:  ?   Head: Normocephalic and atraumatic.  ?Eyes:  ?   Conjunctiva/sclera: Conjunctivae normal.  ?Pulmonary:  ?   Effort: Pulmonary effort is normal. No respiratory distress.  ?Musculoskeletal:  ?   Comments: Deformity noted of the left wrist. Good capillary refill, able to wiggle fingers, sensation in tact.  ? ?No midline spinal tenderness, step offs or crepitus.   ?Skin: ?   General: Skin is warm  and dry.  ?Neurological:  ?   Mental Status: She is alert.  ?Psychiatric:     ?   Mood and Affect: Mood normal.     ?   Behavior: Behavior normal.  ? ? ?ED Results / Procedures / Treatments   ?Labs ?(all labs ordered are listed, but only abnormal results are displayed) ?Labs Reviewed - No data to display ? ?EKG ?None ? ?Radiology ?DG Lumbar Spine Complete ? ?Result Date: 10/22/2021 ?CLINICAL DATA:  Fall and bilateral EXAM: LUMBAR SPINE - COMPLETE 4+ VIEW COMPARISON:  CT abdomen pelvis 10/12/2017 FINDINGS: Levoconvex lumbar curvature. Unchanged T8, T9, and T10 compression deformities with prior vertebroplasty at T9. There is no evidence of lumbar spine fracture. There is mild to moderate lower lumbar  predominant facet arthropathy. There is a T11 vertebral body hemangioma is seen on prior CT. IMPRESSION: No evidence of lumbar spine fracture. Unchanged lower thoracic spine compression deformities. Electronically Signed   By: Maurine Simmering M.D.   On: 10/22/2021 16:48  ? ?DG Wrist Complete Left ? ?Result Date: 10/22/2021 ?CLINICAL DATA:  Trauma, pain EXAM: LEFT WRIST - COMPLETE 3+ VIEW COMPARISON:  None Available. FINDINGS: There is comminuted fracture in the distal metaphysis of radius. There is dorsal displacement of distal major fracture fragments. There is possible extension of the fracture line to the articular surface. There is comminuted, essentially undisplaced fracture in the ulnar styloid. Degenerative changes are noted in first carpometacarpal joint. IMPRESSION: Severely comminuted displaced fracture is seen in the distal left radius. Essentially undisplaced fracture is seen in the ulnar styloid. Degenerative changes are noted in first carpometacarpal joint. Electronically Signed   By: Elmer Picker M.D.   On: 10/22/2021 15:06   ? ?Procedures ?.Nerve Block ? ?Date/Time: 10/22/2021 8:10 PM ?Performed by: Kateri Plummer, PA-C ?Authorized by: Kateri Plummer, PA-C  ? ?Consent:  ?  Consent obtained:  Verbal ?  Consent given by:  Patient ?  Risks discussed:  Pain and unsuccessful block ?Universal protocol:  ?  Patient identity confirmed:  Verbally with patient ?Indications:  ?  Indications:  Procedural anesthesia ?Location:  ?  Body area:  Upper extremity ?  Upper extremity nerve blocked: hematoma block, left radius. ?  Laterality:  Left ?Pre-procedure details:  ?  Skin preparation:  Alcohol ?Procedure details:  ?  Block needle gauge:  25 G ?  Anesthetic injected:  Lidocaine 2% w/o epi ?  Injection procedure:  Anatomic landmarks palpated, anatomic landmarks identified and negative aspiration for blood ?Post-procedure details:  ?  Outcome:  Anesthesia achieved ?  Procedure completion:  Tolerated well,  no immediate complications ?Reduction of fracture ? ?Date/Time: 10/22/2021 8:12 PM ?Performed by: Davonna Belling, MD ?Authorized by: Davonna Belling, MD  ?Consent: Verbal consent obtained. ?Consent given by: patient ?Patient understanding: patient states understanding of the procedure being performed ?Patient consent: the patient's understanding of the procedure matches consent given ?Imaging studies: imaging studies available ?Patient identity confirmed: verbally with patient ?Preparation: Patient was prepped and draped in the usual sterile fashion. ?Local anesthesia used: yes ?Anesthesia: hematoma block ? ?Anesthesia: ?Local anesthesia used: yes ?Anesthetic total: 4 mL ? ?Sedation: ?Patient sedated: no ? ?Patient tolerance: patient tolerated the procedure well with no immediate complications ? ? ?[facility] KitchenOrtho Injury Treatment ? ?Date/Time: 10/22/2021 8:13 PM ?Performed by: Kateri Plummer, PA-C ?Authorized by: Kateri Plummer, PA-C  ? ?Consent:  ?  Consent obtained:  Verbal ?  Consent given by:  Patient ?  Risks discussed:  Restricted joint  movementInjury location: wrist ?Location details: left wrist ?Injury type: fracture ?Fracture type: distal radius ?Pre-procedure neurovascular assessment: neurovascularly intact ?Pre-procedure distal perfusion: normal ?Pre-procedure neurological function: normal ?Pre-procedure range of motion: reduced ?Anesthesia: hematoma block ? ?Anesthesia: ?Local anesthesia used: yes ?Local Anesthetic: lidocaine 2% without epinephrine ? ?Patient sedated: NoManipulation performed: yes ?Skin traction used: Finger trap. ?Reduction successful: yes ?Immobilization: splint and sling ?Splint type: sugar tong ?Splint Applied by: ED Tech ?Supplies used: cotton padding ?Post-procedure neurovascular assessment: post-procedure neurovascularly intact ?Post-procedure distal perfusion: normal ?Post-procedure neurological function: normal ?Post-procedure range of motion: unchanged ? ?  ? ? ?Medications  Ordered in ED ?Medications  ?ibuprofen (ADVIL) tablet 400 mg (400 mg Oral Given 10/22/21 1436)  ?lidocaine (XYLOCAINE) 2 % (with pres) injection 400 mg (400 mg Infiltration Given by Other 10/22/21 1644)  ? ?

## 2021-10-22 NOTE — ED Notes (Signed)
Discharge paperwork given and understood. 

## 2021-10-22 NOTE — ED Notes (Signed)
Ice pack and cardboard splint applied to L wrist in triage.  ?

## 2021-10-25 DIAGNOSIS — S5292XA Unspecified fracture of left forearm, initial encounter for closed fracture: Secondary | ICD-10-CM | POA: Diagnosis not present

## 2021-10-25 DIAGNOSIS — I10 Essential (primary) hypertension: Secondary | ICD-10-CM | POA: Diagnosis not present

## 2021-10-27 DIAGNOSIS — S52532A Colles' fracture of left radius, initial encounter for closed fracture: Secondary | ICD-10-CM | POA: Diagnosis not present

## 2021-11-01 DIAGNOSIS — X58XXXA Exposure to other specified factors, initial encounter: Secondary | ICD-10-CM | POA: Diagnosis not present

## 2021-11-01 DIAGNOSIS — S52571A Other intraarticular fracture of lower end of right radius, initial encounter for closed fracture: Secondary | ICD-10-CM | POA: Diagnosis not present

## 2021-11-01 DIAGNOSIS — Y929 Unspecified place or not applicable: Secondary | ICD-10-CM | POA: Diagnosis not present

## 2021-11-01 DIAGNOSIS — S52532A Colles' fracture of left radius, initial encounter for closed fracture: Secondary | ICD-10-CM | POA: Diagnosis not present

## 2021-11-01 DIAGNOSIS — G8918 Other acute postprocedural pain: Secondary | ICD-10-CM | POA: Diagnosis not present

## 2021-11-08 ENCOUNTER — Emergency Department (HOSPITAL_COMMUNITY): Payer: Medicare Other

## 2021-11-08 ENCOUNTER — Other Ambulatory Visit: Payer: Self-pay

## 2021-11-08 ENCOUNTER — Emergency Department (HOSPITAL_COMMUNITY)
Admission: EM | Admit: 2021-11-08 | Discharge: 2021-11-08 | Disposition: A | Discharge status: Home / Self Care | Payer: Medicare Other | Attending: Emergency Medicine | Admitting: Emergency Medicine

## 2021-11-08 ENCOUNTER — Encounter (HOSPITAL_COMMUNITY): Payer: Self-pay

## 2021-11-08 DIAGNOSIS — I10 Essential (primary) hypertension: Secondary | ICD-10-CM | POA: Insufficient documentation

## 2021-11-08 DIAGNOSIS — R1032 Left lower quadrant pain: Secondary | ICD-10-CM | POA: Diagnosis not present

## 2021-11-08 DIAGNOSIS — Z79899 Other long term (current) drug therapy: Secondary | ICD-10-CM | POA: Diagnosis not present

## 2021-11-08 DIAGNOSIS — D72829 Elevated white blood cell count, unspecified: Secondary | ICD-10-CM | POA: Diagnosis not present

## 2021-11-08 DIAGNOSIS — R109 Unspecified abdominal pain: Secondary | ICD-10-CM | POA: Diagnosis not present

## 2021-11-08 DIAGNOSIS — R1030 Lower abdominal pain, unspecified: Secondary | ICD-10-CM | POA: Diagnosis not present

## 2021-11-08 DIAGNOSIS — E876 Hypokalemia: Secondary | ICD-10-CM | POA: Diagnosis not present

## 2021-11-08 DIAGNOSIS — Z7982 Long term (current) use of aspirin: Secondary | ICD-10-CM | POA: Insufficient documentation

## 2021-11-08 DIAGNOSIS — I700 Atherosclerosis of aorta: Secondary | ICD-10-CM | POA: Diagnosis not present

## 2021-11-08 LAB — URINALYSIS, ROUTINE W REFLEX MICROSCOPIC
Bacteria, UA: NONE SEEN
Bilirubin Urine: NEGATIVE
Glucose, UA: NEGATIVE mg/dL
Ketones, ur: 5 mg/dL — AB
Nitrite: NEGATIVE
Protein, ur: NEGATIVE mg/dL
Specific Gravity, Urine: 1.014 (ref 1.005–1.030)
pH: 5 (ref 5.0–8.0)

## 2021-11-08 LAB — CBC
HCT: 45.4 % (ref 36.0–46.0)
Hemoglobin: 15.3 g/dL — ABNORMAL HIGH (ref 12.0–15.0)
MCH: 30.8 pg (ref 26.0–34.0)
MCHC: 33.7 g/dL (ref 30.0–36.0)
MCV: 91.5 fL (ref 80.0–100.0)
Platelets: 408 10*3/uL — ABNORMAL HIGH (ref 150–400)
RBC: 4.96 MIL/uL (ref 3.87–5.11)
RDW: 14.1 % (ref 11.5–15.5)
WBC: 11.1 10*3/uL — ABNORMAL HIGH (ref 4.0–10.5)
nRBC: 0 % (ref 0.0–0.2)

## 2021-11-08 LAB — COMPREHENSIVE METABOLIC PANEL
ALT: 14 U/L (ref 0–44)
AST: 20 U/L (ref 15–41)
Albumin: 4 g/dL (ref 3.5–5.0)
Alkaline Phosphatase: 46 U/L (ref 38–126)
Anion gap: 11 (ref 5–15)
BUN: 12 mg/dL (ref 8–23)
CO2: 26 mmol/L (ref 22–32)
Calcium: 8.9 mg/dL (ref 8.9–10.3)
Chloride: 106 mmol/L (ref 98–111)
Creatinine, Ser: 0.65 mg/dL (ref 0.44–1.00)
GFR, Estimated: >60 mL/min (ref >60)
Glucose, Bld: 135 mg/dL — ABNORMAL HIGH (ref 70–99)
Potassium: 2.9 mmol/L — ABNORMAL LOW (ref 3.5–5.1)
Sodium: 143 mmol/L (ref 135–145)
Total Bilirubin: 0.9 mg/dL (ref 0.3–1.2)
Total Protein: 7.7 g/dL (ref 6.5–8.1)

## 2021-11-08 LAB — LIPASE, BLOOD: Lipase: 25 U/L (ref 11–51)

## 2021-11-08 MED ORDER — SODIUM CHLORIDE 0.9 % IV BOLUS
500.0000 mL | Freq: Once | INTRAVENOUS | Status: AC
  Administered 2021-11-08: 500 mL via INTRAVENOUS

## 2021-11-08 MED ORDER — POTASSIUM CHLORIDE CRYS ER 20 MEQ PO TBCR
40.0000 meq | EXTENDED_RELEASE_TABLET | Freq: Once | ORAL | Status: AC
  Administered 2021-11-08: 40 meq via ORAL
  Filled 2021-11-08: qty 2

## 2021-11-08 MED ORDER — FENTANYL CITRATE PF 50 MCG/ML IJ SOSY
50.0000 ug | PREFILLED_SYRINGE | Freq: Once | INTRAMUSCULAR | Status: AC
  Administered 2021-11-08: 50 ug via INTRAVENOUS
  Filled 2021-11-08: qty 1

## 2021-11-08 MED ORDER — IOHEXOL 300 MG/ML  SOLN
100.0000 mL | Freq: Once | INTRAMUSCULAR | Status: AC | PRN
  Administered 2021-11-08: 80 mL via INTRAVENOUS

## 2021-11-08 NOTE — ED Triage Notes (Signed)
Patient reports that she has had left lower abdominal pain that started 2 days ago. Patient went to [facility] in clinic today with the abdominal pain and was told to come to the ED due to elevated BP (209/85)  Patient reports a history of diverticulitis with diarrhea.

## 2021-11-08 NOTE — ED Provider Notes (Signed)
[facility] DEPT Provider Note   CSN: [id number] Arrival date & time: 11/08/21  1419     History {Add pertinent medical, surgical, social history, OB history to HPI:1} Chief Complaint  Patient presents with   Abdominal Pain   Hypertension    Yolanda Phillips is a 88 y.o. female.  She is complaining of low abdominal pain for the last 3 days.  Yolanda Phillips it is crampy in nature rates it as 5 out of 10.  Sometimes higher with movement.  She has had this before and she has been told it is diverticulitis and other times thought to be due to adhesions, status post total hysterectomy.  She said she has had a little bit of loose stools normal for her she has irritable bowel.  No fevers or chills no nausea or vomiting.  Has only taken Tylenol for it.  She sometimes will use dicyclomine or tramadol.  She had a recent left wrist fracture and underwent surgery.  Still in splint and feels that pain is well controlled.  She is also worried that her blood pressure is elevated and her primary care doctor has recently increased her blood pressure.  Family relates that when she broke her arm her blood pressure was also elevated and they think it is possibly a response to her pain.  No urinary symptoms The history is provided by the patient.  Abdominal Pain Pain location:  RLQ, LLQ and suprapubic Pain quality: cramping   Pain radiates to:  Does not radiate Pain severity:  Moderate Onset quality:  Gradual Duration:  3 days Timing:  Constant Progression:  Waxing and waning Chronicity:  Recurrent Context: not trauma   Relieved by:  None tried Worsened by:  Movement Ineffective treatments:  None tried Associated symptoms: diarrhea   Associated symptoms: no chest pain, no constipation, no cough, no dysuria, no fever, no nausea, no shortness of breath, no sore throat and no vomiting   Hypertension This is a chronic problem. Associated symptoms include abdominal pain. Pertinent  negatives include no chest pain and no shortness of breath. She has tried nothing for the symptoms. The treatment provided no relief.      Home Medications Prior to Admission medications   Medication Sig Start Date End Date Taking? Authorizing Provider  acetaminophen (TYLENOL) 500 MG tablet Take 1,000 mg by mouth every 6 (six) hours as needed.    [provider]  aspirin 325 MG tablet Take 325 mg by mouth once.    [provider]  calcium-vitamin D (OSCAL WITH D) 500-200 MG-UNIT per tablet Take 1 tablet by mouth 2 (two) times daily.    [provider]  COD LIVER OIL/VITAMINS A & D CAPS Take by mouth. 415 mg cod liver oil +A and D    [provider]  diclofenac sodium (VOLTAREN) 1 % GEL Apply 4 g topically 4 (four) times daily. 12/16/18   Deno Etienne, DO  magnesium oxide (MAG-OX) 400 MG tablet Take 400 mg by mouth daily.    [provider]  meclizine (ANTIVERT) 25 MG tablet Take 1 tablet (25 mg total) by mouth 3 (three) times daily as needed for dizziness. 07/24/16   Little, Wenda Overland, MD  metoprolol succinate (TOPROL-XL) 50 MG 24 hr tablet Take 1 tablet by mouth daily. Blood pressure 07/13/16   [provider]  Multiple Vitamin (MULTIVITAMIN WITH MINERALS) TABS Take 1 tablet by mouth daily. One a Day Women's 50 plus    [provider]  pantoprazole (PROTONIX) 40 MG tablet Take 40 mg by mouth daily. 10/20/15   [provider]  zoledronic acid (RECLAST) 5 MG/100ML SOLN injection Inject 5 mg into the vein. Yearly    [provider]      Allergies    Fosamax [alendronate sodium], Boniva [ibandronic acid], Evista [raloxifene], Levbid [hyoscyamine], Nexium [esomeprazole magnesium], Norco [hydrocodone-acetaminophen], Prevacid [lansoprazole], and Morphine and related    Review of Systems   Review of Systems  Constitutional:  Negative for fever.  HENT:  Negative for sore throat.   Respiratory:  Negative for cough and  shortness of breath.   Cardiovascular:  Negative for chest pain.  Gastrointestinal:  Positive for abdominal pain and diarrhea. Negative for constipation, nausea and vomiting.  Genitourinary:  Negative for dysuria.  Skin:  Negative for rash.   Physical Exam Updated Vital Signs BP (!) 191/100   Pulse 81   Temp 97.7 F (36.5 C) (Oral)   Resp 18   Ht '4\' 11"'$  (1.499 m)   Wt 56.2 kg   SpO2 95%   BMI 25.04 kg/m  Physical Exam Vitals and nursing note reviewed.  Constitutional:      General: She is not in acute distress.    Appearance: Normal appearance. She is well-developed.  HENT:     Head: Normocephalic and atraumatic.  Eyes:     Conjunctiva/sclera: Conjunctivae normal.  Cardiovascular:     Rate and Rhythm: Normal rate and regular rhythm.     Heart sounds: No murmur heard. Pulmonary:     Effort: Pulmonary effort is normal. No respiratory distress.     Breath sounds: Normal breath sounds.  Abdominal:     Palpations: Abdomen is soft.     Tenderness: There is no abdominal tenderness. There is no guarding or rebound.  Musculoskeletal:        General: No swelling.     Cervical back: Neck supple.     Comments: Left forearm in splint.  She has some swelling and ecchymosis to her fingers.  Cap refill brisk and sensory intact.  Skin:    General: Skin is warm and dry.     Capillary Refill: Capillary refill takes less than 2 seconds.  Neurological:     General: No focal deficit present.     Mental Status: She is alert.     Sensory: No sensory deficit.     Motor: No weakness.    ED Results / Procedures / Treatments   Labs (all labs ordered are listed, but only abnormal results are displayed) Labs Reviewed  COMPREHENSIVE METABOLIC PANEL - Abnormal; Notable for the following components:      Result Value   Potassium 2.9 (*)    Glucose, Bld 135 (*)    All other components within normal limits  CBC - Abnormal; Notable for the following components:   WBC 11.1 (*)    Hemoglobin  15.3 (*)    Platelets 408 (*)    All other components within normal limits  URINALYSIS, ROUTINE W REFLEX MICROSCOPIC - Abnormal; Notable for the following components:   Hgb urine dipstick SMALL (*)    Ketones, ur 5 (*)    Leukocytes,Ua TRACE (*)    All other components within normal limits  LIPASE, BLOOD    EKG None  Radiology CT ABDOMEN PELVIS W CONTRAST  Result Date: 11/08/2021 CLINICAL DATA:  Left lower quadrant pain for 2 days. EXAM: CT ABDOMEN AND PELVIS WITH CONTRAST TECHNIQUE: Multidetector CT imaging of the abdomen and pelvis was  performed using the standard protocol following bolus administration of intravenous contrast. RADIATION DOSE REDUCTION: This exam was performed according to the departmental dose-optimization program which includes automated exposure control, adjustment of the mA and/or kV according to patient size and/or use of iterative reconstruction technique. CONTRAST:  24m OMNIPAQUE IOHEXOL 300 MG/ML  SOLN COMPARISON:  10/12/2017 FINDINGS: Lower Chest: No acute findings. Hepatobiliary: No hepatic masses identified. Prior cholecystectomy. No evidence of biliary obstruction. Pancreas: No mass or inflammatory changes. No evidence of pancreatic ductal dilatation. Spleen: Within normal limits in size. Sub-cm low-attenuation lesion in posterior aspect of spleen is stable, consistent with benign etiology. Adrenals/Urinary Tract: No masses identified. No evidence of ureteral calculi or hydronephrosis. Stomach/Bowel: Diverticulosis is seen mainly involving the sigmoid colon, however there is no evidence of diverticulitis. No other inflammatory process or abnormal fluid collections are identified. Normal appendix visualized. Vascular/Lymphatic: No pathologically enlarged lymph nodes. No acute vascular findings. Aortic atherosclerotic calcification incidentally noted. Reproductive: Prior hysterectomy noted. Adnexal regions are unremarkable in appearance. Other:  None. Musculoskeletal:   No suspicious bone lesions identified. IMPRESSION: Colonic diverticulosis, without radiographic evidence of diverticulitis or other acute findings. Aortic Atherosclerosis (ICD10-I70.0). Electronically Signed   By: JMarlaine HindM.D.   On: 11/08/2021 17:14    Procedures Procedures  {Document cardiac monitor, telemetry assessment procedure when appropriate:1}  Medications Ordered in ED Medications  fentaNYL (SUBLIMAZE) injection 50 mcg (has no administration in time range)  sodium chloride 0.9 % bolus 500 mL (has no administration in time range)  iohexol (OMNIPAQUE) 300 MG/ML solution 100 mL (80 mLs Intravenous Contrast Given 11/08/21 1643)    ED Course/ Medical Decision Making/ A&P                           Medical Decision Making Amount and/or Complexity of Data Reviewed Labs: ordered. Radiology: ordered.  Risk Prescription drug management.  This patient complains of ***; this involves an extensive number of treatment Options and is a complaint that carries with it a high risk of complications and morbidity. The differential includes ***  I ordered, reviewed and interpreted labs, which included *** I ordered medication *** and reviewed PMP when indicated. I ordered imaging studies which included *** and I independently    visualized and interpreted imaging which showed *** Additional history obtained from *** Previous records obtained and reviewed *** I consulted *** and discussed lab and imaging findings and discussed disposition.  Cardiac monitoring reviewed, *** Social determinants considered, *** Critical Interventions: ***  After the interventions stated above, I reevaluated the patient and found *** Admission and further testing considered, ***    {Document critical care time when appropriate:1} {Document review of labs and clinical decision tools ie heart score, Chads2Vasc2 etc:1}  {Document your independent review of radiology images, and any outside  records:1} {Document your discussion with family members, caretakers, and with consultants:1} {Document social determinants of health affecting pt's care:1} {Document your decision making why or why not admission, treatments were needed:1} Final Clinical Impression(s) / ED Diagnoses Final diagnoses:  None    Rx / DC Orders ED Discharge Orders     None

## 2021-11-08 NOTE — Discharge Instructions (Signed)
You were seen in the emergency department for low abdominal pain and elevated blood pressure.  You had blood work urinalysis and a CAT scan of your abdomen and pelvis that did not show any significant abnormalities other than a low potassium.  You received some IV fluids potassium and pain medication with improvement in your abdominal pain.  Your blood pressure is still elevated and this will need to be followed up with your primary care doctor.  Please continue your regular medications.  Return to the emergency department if any high fevers or worsening symptoms.

## 2021-11-08 NOTE — ED Notes (Signed)
MD made aware of final BP. Instruction were to have the patient go home and take her evening medication. Patient agrees.

## 2021-11-08 NOTE — ED Provider Triage Note (Signed)
Emergency Medicine Provider Triage Evaluation Note  Yolanda Phillips , a 88 y.o. female  was evaluated in triage.  Pt complains of lower abdominal pain and high blood pressure.  Was sent over from [facility] walk-in clinic.  She has had lower abdominal pain, intermittent over the past 2 to 3 days.  She has tried Bentyl with some improvement.  She has had diarrhea, question some blood in the stool at times.  No urinary symptoms.  She has had subjective fever.  She states that the provider at the clinic was also very concerned about her blood pressure being elevated and her potentially having a stroke, although patient currently denies strokelike symptoms.  She has taken both of her blood pressure medications this morning.  Review of Systems  Positive: Abdominal pain, diarrhea Negative: Unilateral weakness, vision change, headache  Physical Exam  BP (!) 208/98 (BP Location: Right Arm) Comment: manual  Pulse 76   Temp 97.7 F (36.5 C) (Oral)   Resp 18   Ht '4\' 11"'$  (1.499 m)   Wt 56.2 kg   SpO2 98%   BMI 25.04 kg/m  Gen:   Awake, no distress   Resp:  Normal effort  MSK:   Moves extremities without difficulty  Other:  Mild lower abdominal tenderness to palpation without rebound or guarding  Medical Decision Making  Medically screening exam initiated at 3:23 PM.  Appropriate orders placed.  BEKAH IGOE was informed that the remainder of the evaluation will be completed by another provider, this initial triage assessment does not replace that evaluation, and the importance of remaining in the ED until their evaluation is complete.     Carlisle Cater, PA-C 11/08/21 1525

## 2021-11-08 NOTE — ED Triage Notes (Addendum)
Charted on wrong patient

## 2021-11-10 ENCOUNTER — Encounter (HOSPITAL_COMMUNITY): Payer: Self-pay

## 2021-11-10 ENCOUNTER — Emergency Department (HOSPITAL_COMMUNITY): Payer: Medicare Other

## 2021-11-10 ENCOUNTER — Emergency Department (HOSPITAL_COMMUNITY)
Admission: EM | Admit: 2021-11-10 | Discharge: 2021-11-11 | Disposition: A | Discharge status: Home / Self Care | Payer: Medicare Other | Attending: Emergency Medicine | Admitting: Emergency Medicine

## 2021-11-10 ENCOUNTER — Other Ambulatory Visit: Payer: Self-pay

## 2021-11-10 DIAGNOSIS — Z79899 Other long term (current) drug therapy: Secondary | ICD-10-CM | POA: Diagnosis not present

## 2021-11-10 DIAGNOSIS — I10 Essential (primary) hypertension: Secondary | ICD-10-CM | POA: Diagnosis not present

## 2021-11-10 DIAGNOSIS — R519 Headache, unspecified: Secondary | ICD-10-CM | POA: Diagnosis not present

## 2021-11-10 LAB — CBC WITH DIFFERENTIAL/PLATELET
Abs Immature Granulocytes: 0.02 10*3/uL (ref 0.00–0.07)
Basophils Absolute: 0.1 10*3/uL (ref 0.0–0.1)
Basophils Relative: 1 %
Eosinophils Absolute: 0.1 10*3/uL (ref 0.0–0.5)
Eosinophils Relative: 1 %
HCT: 41.2 % (ref 36.0–46.0)
Hemoglobin: 13.7 g/dL (ref 12.0–15.0)
Immature Granulocytes: 0 %
Lymphocytes Relative: 35 %
Lymphs Abs: 2.8 10*3/uL (ref 0.7–4.0)
MCH: 30.3 pg (ref 26.0–34.0)
MCHC: 33.3 g/dL (ref 30.0–36.0)
MCV: 91.2 fL (ref 80.0–100.0)
Monocytes Absolute: 0.9 10*3/uL (ref 0.1–1.0)
Monocytes Relative: 11 %
Neutro Abs: 4.1 10*3/uL (ref 1.7–7.7)
Neutrophils Relative %: 52 %
Platelets: 363 10*3/uL (ref 150–400)
RBC: 4.52 MIL/uL (ref 3.87–5.11)
RDW: 13.8 % (ref 11.5–15.5)
WBC: 7.9 10*3/uL (ref 4.0–10.5)
nRBC: 0 % (ref 0.0–0.2)

## 2021-11-10 NOTE — ED Triage Notes (Signed)
Pt states that her BP has been high for 2 weeks, she reports that her provider upped her dosage of medications but it has not been helping. Pt started having a headache today.

## 2021-11-10 NOTE — ED Provider Notes (Signed)
[facility] DEPT Provider Note   CSN: [id number] Arrival date & time: 11/10/21  2115     History {Add pertinent medical, surgical, social history, OB history to HPI:1} Chief Complaint  Patient presents with   Headache   Hypertension    Yolanda Phillips is a 88 y.o. female.  HPI She is presenting for evaluation of headache.  Evaluated in the ED, 2 days ago for lower abdominal pain with CT imaging.  Saw her PCP yesterday and her losartan was increased from 50 to 100 mg.  She continues taking metoprolol.  Patient and family members are concerned about ongoing headache for several days, and one of her sons with her states her speech has been slurred, but it is not now.  He does not have any focal weakness or paresthesia.  She continues to be ambulatory.  Her son states that she has anxiety and that she has repeatedly been calling them to report a headache.  There is been no trauma.  She is taking her usual prescribed medications.    Home Medications Prior to Admission medications   Medication Sig Start Date End Date Taking? Authorizing Provider  acetaminophen (TYLENOL) 500 MG tablet Take 1,000 mg by mouth every 6 (six) hours as needed.    [provider]  aspirin 325 MG tablet Take 325 mg by mouth once.    [provider]  calcium-vitamin D (OSCAL WITH D) 500-200 MG-UNIT per tablet Take 1 tablet by mouth 2 (two) times daily.    [provider]  COD LIVER OIL/VITAMINS A & D CAPS Take by mouth. 415 mg cod liver oil +A and D    [provider]  diclofenac sodium (VOLTAREN) 1 % GEL Apply 4 g topically 4 (four) times daily. 12/16/18   Deno Etienne, DO  magnesium oxide (MAG-OX) 400 MG tablet Take 400 mg by mouth daily.    [provider]  meclizine (ANTIVERT) 25 MG tablet Take 1 tablet (25 mg total) by mouth 3 (three) times daily as needed for dizziness. 07/24/16   Little, Wenda Overland, MD  metoprolol succinate (TOPROL-XL) 50  MG 24 hr tablet Take 1 tablet by mouth daily. Blood pressure 07/13/16   [provider]  Multiple Vitamin (MULTIVITAMIN WITH MINERALS) TABS Take 1 tablet by mouth daily. One a Day Women's 50 plus    [provider]  pantoprazole (PROTONIX) 40 MG tablet Take 40 mg by mouth daily. 10/20/15   [provider]  zoledronic acid (RECLAST) 5 MG/100ML SOLN injection Inject 5 mg into the vein. Yearly    [provider]      Allergies    Fosamax [alendronate sodium], Boniva [ibandronic acid], Evista [raloxifene], Levbid [hyoscyamine], Nexium [esomeprazole magnesium], Norco [hydrocodone-acetaminophen], Prevacid [lansoprazole], and Morphine and related    Review of Systems   Review of Systems  Physical Exam Updated Vital Signs BP (!) 160/115 (BP Location: Right Arm)   Pulse 79   Temp 98.3 F (36.8 C) (Oral)   Resp 18   SpO2 96%  Physical Exam Vitals and nursing note reviewed.  Constitutional:      General: She is not in acute distress.    Appearance: She is well-developed. She is not ill-appearing, toxic-appearing or diaphoretic.  HENT:     Head: Normocephalic and atraumatic.     Nose: No congestion or rhinorrhea.  Eyes:     Conjunctiva/sclera: Conjunctivae normal.     Pupils: Pupils are equal, round, and reactive to light.  Neck:     Trachea: Phonation normal.  Cardiovascular:     Rate and Rhythm: Normal rate and regular rhythm.  Pulmonary:     Effort: Pulmonary effort is normal.     Breath sounds: Normal breath sounds.  Chest:     Chest wall: No tenderness.  Abdominal:     General: There is no distension.     Palpations: Abdomen is soft.     Tenderness: There is no abdominal tenderness. There is no guarding.  Musculoskeletal:        General: Normal range of motion.     Cervical back: Normal range of motion and neck supple.  Skin:    General: Skin is warm and dry.  Neurological:     Mental Status: She is alert and oriented to person, place, and  time.     Motor: No abnormal muscle tone.     Comments: No dysarthria aphasia or nystagmus.  Psychiatric:        Behavior: Behavior normal.        Thought Content: Thought content normal.        Judgment: Judgment normal.    ED Results / Procedures / Treatments   Labs (all labs ordered are listed, but only abnormal results are displayed) Labs Reviewed - No data to display  EKG None  Radiology No results found.  Procedures Procedures  {Document cardiac monitor, telemetry assessment procedure when appropriate:1}  Medications Ordered in ED Medications - No data to display  ED Course/ Medical Decision Making/ A&P                           Medical Decision Making Patient presenting with headache, and mildly elevated blood pressure.  Losartan dose was increased yesterday.  No overt signs of acute neurologic abnormality.  Screening CT ordered to evaluate for occult stroke.  Will assess blood work for other signs of hypertensive urgency.  Amount and/or Complexity of Data Reviewed Independent Historian:     Details: She is a cogent historian.  Son is at bedside support the history. Labs: ordered.    Details: CBC, BMET-   ***  {Document critical care time when appropriate:1} {Document review of labs and clinical decision tools ie heart score, Chads2Vasc2 etc:1}  {Document your independent review of radiology images, and any outside records:1} {Document your discussion with family members, caretakers, and with consultants:1} {Document social determinants of health affecting pt's care:1} {Document your decision making why or why not admission, treatments were needed:1} Final Clinical Impression(s) / ED Diagnoses Final diagnoses:  None    Rx / DC Orders ED Discharge Orders     None

## 2021-11-11 LAB — BASIC METABOLIC PANEL
Anion gap: 8 (ref 5–15)
BUN: 14 mg/dL (ref 8–23)
CO2: 24 mmol/L (ref 22–32)
Calcium: 9.2 mg/dL (ref 8.9–10.3)
Chloride: 110 mmol/L (ref 98–111)
Creatinine, Ser: 0.63 mg/dL (ref 0.44–1.00)
GFR, Estimated: >60 mL/min (ref >60)
Glucose, Bld: 115 mg/dL — ABNORMAL HIGH (ref 70–99)
Potassium: 3.8 mmol/L (ref 3.5–5.1)
Sodium: 142 mmol/L (ref 135–145)

## 2021-11-11 NOTE — Discharge Instructions (Signed)
Your blood pressure is high however the CT scan and blood work is normal.  There are no signs of dangerous problems from the blood pressure elevation.  Continue taking losartan, as it may lower your blood pressure gradually as expected.  It usually takes at least 7 days to know if it is going to work.  At that time your doctor can consider other management including new medicine.  Return here if needed for problems.  Things to watch out for include dizziness, weakness, nausea, vomiting, change in vision or or trouble walking.

## 2021-11-15 DIAGNOSIS — I10 Essential (primary) hypertension: Secondary | ICD-10-CM | POA: Diagnosis not present

## 2021-11-29 DIAGNOSIS — I10 Essential (primary) hypertension: Secondary | ICD-10-CM | POA: Diagnosis not present

## 2021-12-27 DIAGNOSIS — M549 Dorsalgia, unspecified: Secondary | ICD-10-CM | POA: Diagnosis not present

## 2021-12-27 DIAGNOSIS — M810 Age-related osteoporosis without current pathological fracture: Secondary | ICD-10-CM | POA: Diagnosis not present

## 2021-12-27 DIAGNOSIS — Z79899 Other long term (current) drug therapy: Secondary | ICD-10-CM | POA: Diagnosis not present

## 2021-12-27 DIAGNOSIS — M1990 Unspecified osteoarthritis, unspecified site: Secondary | ICD-10-CM | POA: Diagnosis not present

## 2021-12-27 DIAGNOSIS — M25569 Pain in unspecified knee: Secondary | ICD-10-CM | POA: Diagnosis not present

## 2021-12-29 DIAGNOSIS — I10 Essential (primary) hypertension: Secondary | ICD-10-CM | POA: Diagnosis not present

## 2022-03-12 DIAGNOSIS — Z23 Encounter for immunization: Secondary | ICD-10-CM | POA: Diagnosis not present

## 2022-03-15 DIAGNOSIS — Z1231 Encounter for screening mammogram for malignant neoplasm of breast: Secondary | ICD-10-CM | POA: Diagnosis not present

## 2022-04-05 DIAGNOSIS — Z961 Presence of intraocular lens: Secondary | ICD-10-CM | POA: Diagnosis not present

## 2022-04-05 DIAGNOSIS — B0231 Zoster conjunctivitis: Secondary | ICD-10-CM | POA: Diagnosis not present

## 2022-04-05 DIAGNOSIS — H02204 Unspecified lagophthalmos left upper eyelid: Secondary | ICD-10-CM | POA: Diagnosis not present

## 2022-04-05 DIAGNOSIS — H53481 Generalized contraction of visual field, right eye: Secondary | ICD-10-CM | POA: Diagnosis not present

## 2022-04-05 DIAGNOSIS — H47293 Other optic atrophy, bilateral: Secondary | ICD-10-CM | POA: Diagnosis not present

## 2022-05-04 DIAGNOSIS — M1990 Unspecified osteoarthritis, unspecified site: Secondary | ICD-10-CM | POA: Diagnosis not present

## 2022-05-04 DIAGNOSIS — I10 Essential (primary) hypertension: Secondary | ICD-10-CM | POA: Diagnosis not present

## 2022-05-04 DIAGNOSIS — I700 Atherosclerosis of aorta: Secondary | ICD-10-CM | POA: Diagnosis not present

## 2022-05-04 DIAGNOSIS — M810 Age-related osteoporosis without current pathological fracture: Secondary | ICD-10-CM | POA: Diagnosis not present

## 2022-05-04 DIAGNOSIS — Z0000 Encounter for general adult medical examination without abnormal findings: Secondary | ICD-10-CM | POA: Diagnosis not present

## 2022-05-04 DIAGNOSIS — K589 Irritable bowel syndrome without diarrhea: Secondary | ICD-10-CM | POA: Diagnosis not present

## 2022-05-04 DIAGNOSIS — R7301 Impaired fasting glucose: Secondary | ICD-10-CM | POA: Diagnosis not present

## 2022-05-04 DIAGNOSIS — K219 Gastro-esophageal reflux disease without esophagitis: Secondary | ICD-10-CM | POA: Diagnosis not present

## 2022-05-04 DIAGNOSIS — R5383 Other fatigue: Secondary | ICD-10-CM | POA: Diagnosis not present

## 2022-05-04 DIAGNOSIS — E7800 Pure hypercholesterolemia, unspecified: Secondary | ICD-10-CM | POA: Diagnosis not present

## 2022-05-13 ENCOUNTER — Emergency Department (HOSPITAL_BASED_OUTPATIENT_CLINIC_OR_DEPARTMENT_OTHER)
Admission: EM | Admit: 2022-05-13 | Discharge: 2022-05-13 | Disposition: A | Discharge status: Home / Self Care | Payer: Medicare Other | Attending: Emergency Medicine | Admitting: Emergency Medicine

## 2022-05-13 ENCOUNTER — Other Ambulatory Visit: Payer: Self-pay

## 2022-05-13 ENCOUNTER — Emergency Department (HOSPITAL_BASED_OUTPATIENT_CLINIC_OR_DEPARTMENT_OTHER): Payer: Medicare Other

## 2022-05-13 ENCOUNTER — Emergency Department (HOSPITAL_BASED_OUTPATIENT_CLINIC_OR_DEPARTMENT_OTHER): Payer: Medicare Other | Admitting: Radiology

## 2022-05-13 DIAGNOSIS — R079 Chest pain, unspecified: Secondary | ICD-10-CM

## 2022-05-13 DIAGNOSIS — J181 Lobar pneumonia, unspecified organism: Secondary | ICD-10-CM | POA: Insufficient documentation

## 2022-05-13 DIAGNOSIS — J90 Pleural effusion, not elsewhere classified: Secondary | ICD-10-CM | POA: Diagnosis not present

## 2022-05-13 DIAGNOSIS — J189 Pneumonia, unspecified organism: Secondary | ICD-10-CM

## 2022-05-13 DIAGNOSIS — I10 Essential (primary) hypertension: Secondary | ICD-10-CM | POA: Insufficient documentation

## 2022-05-13 DIAGNOSIS — Z79899 Other long term (current) drug therapy: Secondary | ICD-10-CM | POA: Insufficient documentation

## 2022-05-13 DIAGNOSIS — R0789 Other chest pain: Secondary | ICD-10-CM | POA: Diagnosis not present

## 2022-05-13 LAB — BASIC METABOLIC PANEL
Anion gap: 10 (ref 5–15)
BUN: 17 mg/dL (ref 8–23)
CO2: 28 mmol/L (ref 22–32)
Calcium: 9.5 mg/dL (ref 8.9–10.3)
Chloride: 98 mmol/L (ref 98–111)
Creatinine, Ser: 0.75 mg/dL (ref 0.44–1.00)
GFR, Estimated: >60 mL/min (ref >60)
Glucose, Bld: 122 mg/dL — ABNORMAL HIGH (ref 70–99)
Potassium: 3.8 mmol/L (ref 3.5–5.1)
Sodium: 136 mmol/L (ref 135–145)

## 2022-05-13 LAB — TROPONIN I (HIGH SENSITIVITY)
Troponin I (High Sensitivity): 4 ng/L (ref <18)
Troponin I (High Sensitivity): 4 ng/L (ref <18)

## 2022-05-13 LAB — CBC
HCT: 42.9 % (ref 36.0–46.0)
Hemoglobin: 14 g/dL (ref 12.0–15.0)
MCH: 29.7 pg (ref 26.0–34.0)
MCHC: 32.6 g/dL (ref 30.0–36.0)
MCV: 90.9 fL (ref 80.0–100.0)
Platelets: 323 10*3/uL (ref 150–400)
RBC: 4.72 MIL/uL (ref 3.87–5.11)
RDW: 12.4 % (ref 11.5–15.5)
WBC: 11.3 10*3/uL — ABNORMAL HIGH (ref 4.0–10.5)
nRBC: 0 % (ref 0.0–0.2)

## 2022-05-13 MED ORDER — FAMOTIDINE 20 MG PO TABS
40.0000 mg | ORAL_TABLET | Freq: Every day | ORAL | Status: DC
  Administered 2022-05-13: 40 mg via ORAL
  Filled 2022-05-13: qty 2

## 2022-05-13 MED ORDER — DOXYCYCLINE HYCLATE 100 MG PO TABS
100.0000 mg | ORAL_TABLET | Freq: Once | ORAL | Status: AC
  Administered 2022-05-13: 100 mg via ORAL
  Filled 2022-05-13: qty 1

## 2022-05-13 MED ORDER — DOXYCYCLINE HYCLATE 100 MG PO CAPS: 100.0000 mg | ORAL_CAPSULE | Freq: Two times a day (BID) | ORAL | 0 refills | Status: AC

## 2022-05-13 MED ORDER — IOHEXOL 350 MG/ML SOLN
100.0000 mL | Freq: Once | INTRAVENOUS | Status: AC | PRN
  Administered 2022-05-13: 100 mL via INTRAVENOUS

## 2022-05-13 MED ORDER — SODIUM CHLORIDE 0.9 % IV SOLN
1.0000 g | Freq: Once | INTRAVENOUS | Status: AC
  Administered 2022-05-13: 1 g via INTRAVENOUS
  Filled 2022-05-13: qty 10

## 2022-05-13 MED ORDER — ALUM & MAG HYDROXIDE-SIMETH 200-200-20 MG/5ML PO SUSP
30.0000 mL | Freq: Once | ORAL | Status: AC
  Administered 2022-05-13: 30 mL via ORAL
  Filled 2022-05-13: qty 30

## 2022-05-13 NOTE — ED Provider Notes (Signed)
[facility] EMERGENCY DEPT Provider Note   CSN: [id number] Arrival date & time: 05/13/22  1439     History  Chief Complaint  Patient presents with   Chest Pain    Yolanda Phillips is a 88 y.o. female w/ hx HTN, Reflux, presenting with left sided chest heaviness.  Onset upon waking up this morning, constant, left sided aching.  Feels like her "reflux" but "worse."  Denies syncope, diaphoresis, radiation.  Worse with inspiration and movement. Denies fevers, coughin, chills this week.  Denies hx of MI or known CAD/stents.  Took aspirin 81 mg at home  HPI     Home Medications Prior to Admission medications   Medication Sig Start Date End Date Taking? Authorizing Provider  doxycycline (VIBRAMYCIN) 100 MG capsule Take 1 capsule (100 mg total) by mouth 2 (two) times daily for 7 days. 05/14/22 05/21/22 Yes Gayland Nicol, Carola Rhine, MD  acetaminophen (TYLENOL) 500 MG tablet Take 1,000 mg by mouth every 4 (four) hours as needed for mild pain.    [provider]  calcium-vitamin D (OSCAL WITH D) 500-200 MG-UNIT per tablet Take 1 tablet by mouth 2 (two) times daily.    [provider]  denosumab (PROLIA) 60 MG/ML SOSY injection Inject 60 mg into the skin every 6 (six) months. May 1st    [provider]  diclofenac sodium (VOLTAREN) 1 % GEL Apply 4 g topically 4 (four) times daily. Patient not taking: Reported on 11/10/2021 12/16/18   Deno Etienne, DO  losartan (COZAAR) 50 MG tablet Take 50 mg by mouth daily. 10/25/21   [provider]  meclizine (ANTIVERT) 25 MG tablet Take 1 tablet (25 mg total) by mouth 3 (three) times daily as needed for dizziness. Patient not taking: Reported on 11/10/2021 07/24/16   Little, Wenda Overland, MD  metoprolol succinate (TOPROL-XL) 50 MG 24 hr tablet Take 50 mg by mouth daily. Blood pressure 07/13/16   [provider]  Multiple Vitamin (MULTIVITAMIN WITH MINERALS) TABS Take 1 tablet by mouth daily. One a Day Women's 50 plus     [provider]  pantoprazole (PROTONIX) 40 MG tablet Take 40 mg by mouth daily. 10/20/15   [provider]  traMADol (ULTRAM) 50 MG tablet Take 50 mg by mouth every 6 (six) hours as needed for pain. 10/25/21   [provider]      Allergies    Fosamax [alendronate sodium], Boniva [ibandronic acid], Evista [raloxifene], Levbid [hyoscyamine], Nexium [esomeprazole magnesium], Norco [hydrocodone-acetaminophen], Prevacid [lansoprazole], and Morphine and related    Review of Systems   Review of Systems  Physical Exam Updated Vital Signs BP (!) 154/67   Pulse 78   Temp 98.2 F (36.8 C) (Oral)   Resp 19   SpO2 95%  Physical Exam Constitutional:      General: She is not in acute distress. HENT:     Head: Normocephalic and atraumatic.  Eyes:     Conjunctiva/sclera: Conjunctivae normal.     Pupils: Pupils are equal, round, and reactive to light.  Cardiovascular:     Rate and Rhythm: Normal rate and regular rhythm.  Pulmonary:     Effort: Pulmonary effort is normal. No respiratory distress.  Abdominal:     General: There is no distension.     Tenderness: There is no abdominal tenderness.  Skin:    General: Skin is warm and dry.  Neurological:     General: No focal deficit present.     Mental Status: She is alert.  Mental status is at baseline.  Psychiatric:        Mood and Affect: Mood normal.        Behavior: Behavior normal.     ED Results / Procedures / Treatments   Labs (all labs ordered are listed, but only abnormal results are displayed) Labs Reviewed  BASIC METABOLIC PANEL - Abnormal; Notable for the following components:      Result Value   Glucose, Bld 122 (*)    All other components within normal limits  CBC - Abnormal; Notable for the following components:   WBC 11.3 (*)    All other components within normal limits  TROPONIN I (HIGH SENSITIVITY)  TROPONIN I (HIGH SENSITIVITY)    EKG EKG Interpretation  Date/Time:  Friday May 13 2022 14:46:50 EST Ventricular Rate:  74 PR Interval:  138 QRS Duration: 80 QT Interval:  370 QTC Calculation: 410 R Axis:   -3 Text Interpretation: Normal sinus rhythm Left ventricular hypertrophy with repolarization abnormality ( R in aVL ) When compared with ECG of 16-Dec-2018 06:43, PREVIOUS ECG IS PRESENT No significant changes Confirmed by Octaviano Glow [phone]) on 05/13/2022 3:04:51 PM  Radiology CT Angio Chest PE W and/or Wo Contrast  Result Date: 05/13/2022 CLINICAL DATA:  Left-sided pleuritic chest pain. EXAM: CT ANGIOGRAPHY CHEST WITH CONTRAST TECHNIQUE: Multidetector CT imaging of the chest was performed using the standard protocol during bolus administration of intravenous contrast. Multiplanar CT image reconstructions and MIPs were obtained to evaluate the vascular anatomy. RADIATION DOSE REDUCTION: This exam was performed according to the departmental dose-optimization program which includes automated exposure control, adjustment of the mA and/or kV according to patient size and/or use of iterative reconstruction technique. CONTRAST:  111m OMNIPAQUE IOHEXOL 350 MG/ML SOLN COMPARISON:  None Available. FINDINGS: Cardiovascular: Satisfactory opacification of the pulmonary arteries to the segmental level. No evidence of pulmonary embolism. Normal heart size. No pericardial effusion. There are atherosclerotic calcifications of the aorta and coronary arteries. Mediastinum/Nodes: There is an enlarged subcarinal lymph node measuring 17 mm short axis. Visualized esophagus and thyroid gland are within normal limits. Lungs/Pleura: There is a trace left pleural effusion. There is some scarring in both lung bases. There is focal slightly masslike airspace consolidation in the anterior left upper lobe measuring 1.9 x 2.4 x 4.2 cm. There is mild surrounding ground-glass opacity. Upper Abdomen: No acute abnormality. Musculoskeletal: Degenerative changes affect the spine. Vertebroplasty changes are  seen at T9. Vertebral body hemangioma noted at T11. Chronic appearing compression deformities of T7 and T8. Healed manubrial fracture. Review of the MIP images confirms the above findings. IMPRESSION: 1. No evidence for pulmonary embolism. 2. Masslike airspace consolidation in the left upper lobe with surrounding ground-glass opacity. Findings are favored as infectious/inflammatory, but neoplasm can not be excluded. Recommend short-term follow-up CT to confirm resolution. 3. Trace left pleural effusion. 4. Subcarinal lymphadenopathy. Aortic Atherosclerosis (ICD10-I70.0). Electronically Signed   By: ARonney AstersM.D.   On: 05/13/2022 16:34   DG Chest 2 View  Result Date: 05/13/2022 CLINICAL DATA:  Left sided CP. EXAM: CHEST - 2 VIEW COMPARISON:  Chest XR, 12/16/2018. FINDINGS: Cardiomediastinal silhouette is within normal limits given technique and patient position. Lungs are hyperinflated. Nipple shadow. No focal consolidation or mass. No pleural effusion or pneumothorax. Cement augmentation at the midthoracic spine. No acute displaced fracture. IMPRESSION: 1. Obstructive lung disease without acute superimposed cardiopulmonary process. 2.  Aortic Atherosclerosis (ICD10-I70.0). Electronically Signed   By: JMichaelle BirksM.D.   On: 05/13/2022 15:15  Procedures Procedures    Medications Ordered in ED Medications  famotidine (PEPCID) tablet 40 mg (40 mg Oral Given 05/13/22 1528)  alum & mag hydroxide-simeth (MAALOX/MYLANTA) 200-200-20 MG/5ML suspension 30 mL (30 mLs Oral Given 05/13/22 1529)  iohexol (OMNIPAQUE) 350 MG/ML injection 100 mL (100 mLs Intravenous Contrast Given 05/13/22 1620)  cefTRIAXone (ROCEPHIN) 1 g in sodium chloride 0.9 % 100 mL IVPB (0 g Intravenous Stopped 05/13/22 1800)  doxycycline (VIBRA-TABS) tablet 100 mg (100 mg Oral Given 05/13/22 1716)    ED Course/ Medical Decision Making/ A&P                           Medical Decision Making Amount and/or Complexity of Data  Reviewed Labs: ordered. Radiology: ordered.  Risk OTC drugs. Prescription drug management.   This patient presents to the Emergency Department with complaint of chest pain. This involves an extensive number of treatment options, and is a complaint that carries with it a high risk of complications and morbidity, given the patient's comorbidity, including HTN .The differential diagnosis includes ACS vs Pneumothorax vs Reflux/Gastritis vs MSK pain vs Pneumonia vs other.  I felt PE was less likely given that PE scan was negative  I ordered, reviewed, and interpreted labs.  Pertinent results include very mild leukocytosis, troponins are low and flat I ordered medication GI cocktail, for chest pain I ordered imaging studies which included dg chest, CT PE I independently visualized and interpreted imaging which showed left upper lobe mass versus infection inflammatory findings, and the monitor tracing which showed NSR . I agree with the radiologist interpretation Additional history was obtained from son at bedside I personally reviewed the patients ECG which showed sinus rhythm with no acute ischemic findings  Based on the patient's clinical exam, vital signs, risk factors, and ED testing, I felt that the patient's overall risk of life-threatening emergency such as ACS, PE, sepsis, or infection was low.  At this time, I felt the patient's presentation was most clinically consistent with Pneumonia, but explained to the patient that this evaluation was not a definitive diagnostic workup.  I made clear to the patient and his son that she needs close outpatient follow-up, ideally within the week with his PCPs office, as she is completing her antibiotics to ensure that this infection is resolving.  It is not clear from the CT scan whether this is in fact infectious or a pulmonary mass.  This may be the cause of her symptoms.  I have a lower suspicion for ACS given this finding.  I also explained is  possible this is a reflux issue, she did report improvement of her symptoms after GI medications.         Final Clinical Impression(s) / ED Diagnoses Final diagnoses:  Pneumonia of right upper lobe due to infectious organism  Chest pain, unspecified type    Rx / DC Orders ED Discharge Orders          Ordered    doxycycline (VIBRAMYCIN) 100 MG capsule  2 times daily        05/13/22 1717              Wyvonnia Dusky, MD 05/13/22 [phone]

## 2022-05-13 NOTE — ED Notes (Signed)
Patient transported to X-ray 

## 2022-05-13 NOTE — ED Notes (Signed)
Discharge instructions, follow up care, and prescription reviewed and explained, pt verbalized understanding. Pt caox4 and ambulatory on d/c in no obvious distress.

## 2022-05-13 NOTE — ED Triage Notes (Signed)
Pt c/o left sided chest pain and left arm pain that started yesterday. Pt states chest hurts worse with deep breaths. Denies any n/v/d . States pain is a soreness.

## 2022-05-13 NOTE — Discharge Instructions (Addendum)
Your CT scan today showed that you have a infection or mass in the left upper lobe of your lung.  We are treating this as a possible pneumonia.  We gave you antibiotics in the ER.  Next course of antibiotics to be tomorrow morning.  It is very important you schedule follow-up appointment with your doctor this week in the office for this issue.  If you have pneumonia we should make sure it is getting better.  If you are not getting any better, it is possible that you may need a cancer work-up.  *  If your chest pain continues to get worse, if you have difficulty breathing, lightheadedness, feel like passing out, or any other emergency concerns, please call 911 or return to the ER.

## 2022-05-16 DIAGNOSIS — R079 Chest pain, unspecified: Secondary | ICD-10-CM | POA: Diagnosis not present

## 2022-05-30 DIAGNOSIS — L821 Other seborrheic keratosis: Secondary | ICD-10-CM | POA: Diagnosis not present

## 2022-05-30 DIAGNOSIS — D225 Melanocytic nevi of trunk: Secondary | ICD-10-CM | POA: Diagnosis not present

## 2022-05-30 DIAGNOSIS — L723 Sebaceous cyst: Secondary | ICD-10-CM | POA: Diagnosis not present

## 2022-05-30 DIAGNOSIS — Z85820 Personal history of malignant melanoma of skin: Secondary | ICD-10-CM | POA: Diagnosis not present

## 2022-06-04 DIAGNOSIS — R1032 Left lower quadrant pain: Secondary | ICD-10-CM | POA: Diagnosis not present

## 2022-06-04 DIAGNOSIS — R1031 Right lower quadrant pain: Secondary | ICD-10-CM | POA: Diagnosis not present

## 2022-06-05 ENCOUNTER — Encounter (HOSPITAL_COMMUNITY): Payer: Self-pay

## 2022-06-05 ENCOUNTER — Emergency Department (HOSPITAL_COMMUNITY)
Admission: EM | Admit: 2022-06-05 | Discharge: 2022-06-05 | Disposition: A | Discharge status: Home / Self Care | Payer: Medicare Other | Attending: Emergency Medicine | Admitting: Emergency Medicine

## 2022-06-05 ENCOUNTER — Emergency Department (HOSPITAL_COMMUNITY): Payer: Medicare Other

## 2022-06-05 DIAGNOSIS — A0472 Enterocolitis due to Clostridium difficile, not specified as recurrent: Secondary | ICD-10-CM | POA: Insufficient documentation

## 2022-06-05 DIAGNOSIS — E876 Hypokalemia: Secondary | ICD-10-CM | POA: Insufficient documentation

## 2022-06-05 DIAGNOSIS — K5730 Diverticulosis of large intestine without perforation or abscess without bleeding: Secondary | ICD-10-CM | POA: Diagnosis not present

## 2022-06-05 DIAGNOSIS — A09 Infectious gastroenteritis and colitis, unspecified: Secondary | ICD-10-CM

## 2022-06-05 DIAGNOSIS — R197 Diarrhea, unspecified: Secondary | ICD-10-CM | POA: Diagnosis not present

## 2022-06-05 DIAGNOSIS — D72829 Elevated white blood cell count, unspecified: Secondary | ICD-10-CM | POA: Diagnosis not present

## 2022-06-05 DIAGNOSIS — K6389 Other specified diseases of intestine: Secondary | ICD-10-CM | POA: Diagnosis not present

## 2022-06-05 DIAGNOSIS — N390 Urinary tract infection, site not specified: Secondary | ICD-10-CM | POA: Insufficient documentation

## 2022-06-05 DIAGNOSIS — R1030 Lower abdominal pain, unspecified: Secondary | ICD-10-CM | POA: Diagnosis present

## 2022-06-05 LAB — CBC
HCT: 39 % (ref 36.0–46.0)
Hemoglobin: 12.2 g/dL (ref 12.0–15.0)
MCH: 28.8 pg (ref 26.0–34.0)
MCHC: 31.3 g/dL (ref 30.0–36.0)
MCV: 92.2 fL (ref 80.0–100.0)
Platelets: 248 10*3/uL (ref 150–400)
RBC: 4.23 MIL/uL (ref 3.87–5.11)
RDW: 12.8 % (ref 11.5–15.5)
WBC: 18.3 10*3/uL — ABNORMAL HIGH (ref 4.0–10.5)
nRBC: 0 % (ref 0.0–0.2)

## 2022-06-05 LAB — COMPREHENSIVE METABOLIC PANEL
ALT: 9 U/L (ref 0–44)
AST: 16 U/L (ref 15–41)
Albumin: 3.3 g/dL — ABNORMAL LOW (ref 3.5–5.0)
Alkaline Phosphatase: 53 U/L (ref 38–126)
Anion gap: 9 (ref 5–15)
BUN: 20 mg/dL (ref 8–23)
CO2: 26 mmol/L (ref 22–32)
Calcium: 9.1 mg/dL (ref 8.9–10.3)
Chloride: 105 mmol/L (ref 98–111)
Creatinine, Ser: 0.74 mg/dL (ref 0.44–1.00)
GFR, Estimated: >60 mL/min (ref >60)
Glucose, Bld: 128 mg/dL — ABNORMAL HIGH (ref 70–99)
Potassium: 3 mmol/L — ABNORMAL LOW (ref 3.5–5.1)
Sodium: 140 mmol/L (ref 135–145)
Total Bilirubin: 1.2 mg/dL (ref 0.3–1.2)
Total Protein: 6.9 g/dL (ref 6.5–8.1)

## 2022-06-05 LAB — URINALYSIS, ROUTINE W REFLEX MICROSCOPIC
Bacteria, UA: NONE SEEN
Bilirubin Urine: NEGATIVE
Glucose, UA: NEGATIVE mg/dL
Ketones, ur: 5 mg/dL — AB
Nitrite: NEGATIVE
Protein, ur: NEGATIVE mg/dL
Specific Gravity, Urine: 1.042 — ABNORMAL HIGH (ref 1.005–1.030)
pH: 6 (ref 5.0–8.0)

## 2022-06-05 LAB — GASTROINTESTINAL PANEL BY PCR, STOOL (REPLACES STOOL CULTURE)

## 2022-06-05 LAB — LIPASE, BLOOD: Lipase: 25 U/L (ref 11–51)

## 2022-06-05 LAB — C DIFFICILE QUICK SCREEN W PCR REFLEX
C Diff antigen: POSITIVE — AB
C Diff interpretation: DETECTED
C Diff toxin: POSITIVE — AB

## 2022-06-05 MED ORDER — CEPHALEXIN 500 MG PO CAPS: 500.0000 mg | ORAL_CAPSULE | Freq: Two times a day (BID) | ORAL | 0 refills | Status: AC

## 2022-06-05 MED ORDER — ONDANSETRON HCL 4 MG/2ML IJ SOLN
4.0000 mg | Freq: Once | INTRAMUSCULAR | Status: AC
Start: 2022-06-05 — End: 2022-06-05
  Administered 2022-06-05: 4 mg via INTRAVENOUS
  Filled 2022-06-05: qty 2

## 2022-06-05 MED ORDER — LACTATED RINGERS IV BOLUS
1000.0000 mL | Freq: Once | INTRAVENOUS | Status: AC
  Administered 2022-06-05: 1000 mL via INTRAVENOUS

## 2022-06-05 MED ORDER — DICYCLOMINE HCL 10 MG PO CAPS
10.0000 mg | ORAL_CAPSULE | Freq: Once | ORAL | Status: AC
  Administered 2022-06-05: 10 mg via ORAL
  Filled 2022-06-05: qty 1

## 2022-06-05 MED ORDER — POTASSIUM CHLORIDE CRYS ER 20 MEQ PO TBCR: 20.0000 meq | EXTENDED_RELEASE_TABLET | Freq: Two times a day (BID) | ORAL | 0 refills | Status: DC

## 2022-06-05 MED ORDER — POTASSIUM CHLORIDE CRYS ER 20 MEQ PO TBCR
60.0000 meq | EXTENDED_RELEASE_TABLET | Freq: Once | ORAL | Status: AC
  Administered 2022-06-05: 60 meq via ORAL
  Filled 2022-06-05 (×2): qty 3

## 2022-06-05 MED ORDER — VANCOMYCIN HCL 125 MG PO CAPS
125.0000 mg | ORAL_CAPSULE | Freq: Once | ORAL | Status: AC
  Administered 2022-06-05: 125 mg via ORAL
  Filled 2022-06-05: qty 1

## 2022-06-05 MED ORDER — VANCOMYCIN HCL 125 MG PO CAPS: 125.0000 mg | ORAL_CAPSULE | Freq: Four times a day (QID) | ORAL | 0 refills | Status: AC

## 2022-06-05 MED ORDER — SODIUM CHLORIDE 0.9 % IV SOLN
1.0000 g | Freq: Once | INTRAVENOUS | Status: AC
  Administered 2022-06-05: 1 g via INTRAVENOUS
  Filled 2022-06-05: qty 10

## 2022-06-05 MED ORDER — IOHEXOL 300 MG/ML  SOLN
100.0000 mL | Freq: Once | INTRAMUSCULAR | Status: AC | PRN
  Administered 2022-06-05: 100 mL via INTRAVENOUS

## 2022-06-05 MED ORDER — ONDANSETRON 4 MG PO TBDP: 4.0000 mg | ORAL_TABLET | Freq: Three times a day (TID) | ORAL | 0 refills | Status: DC | PRN

## 2022-06-05 NOTE — ED Triage Notes (Signed)
Pt c/o lower abd pain worse for 3 days, but since taking abx. Pt states after taking abx for PNA, she has had diarrhea as well.  Pt went to [facility], was prescribed reglan. Pt states she is afraid to take it.

## 2022-06-05 NOTE — Discharge Instructions (Addendum)
You have an infectious diarrhea known as C. difficile colitis.  See the attachment provided in your discharge paperwork.  You also have a UTI.  You will need to take the vancomycin to treat the C. difficile and the Keflex to treat the UTI.  Make sure to drink plenty of fluids.  You can take the Zofran as needed for nausea or vomiting.  Your potassium was slightly low so we have ordered a prescription for more potassium repletion over the next 3 days.  You can take Tylenol and ibuprofen as needed for pain.  Follow-up with your primary care doctor regarding your visit to the ER today.  Come back to the ER if you have any progressive worsening diarrhea, severe worsening abdominal pain, uncontrollable vomiting and inability to keep down your medications, severe weakness or fatigue or any other symptoms concerning to you.

## 2022-06-05 NOTE — ED Provider Notes (Signed)
[facility] DEPT Provider Note   CSN: [id number] Arrival date & time: 06/05/22  2130     History  Chief Complaint  Patient presents with   Abdominal Pain    Yolanda Phillips is a 88 y.o. female. With pmh IBS, GERD, diverticulosis presenting with lower abdominal pain and diarrhea. Of note, had CT PE study performed 05/13/22 showing consolidation left upper lobe c/f mass vs pneumonia and discharged on 10 days doxycycline.  Patient has completed antibiotics for over a week now.  However over the past 3 to 4 days she has had increasing cramping pain in her lower abdomen no side more than the other.  Is been associated with multiple episodes of foul-smelling diarrhea.  She does note history of hemorrhoids and has had intermittent episodes of bright red blood per rectum mixed with diarrhea.  She says whenever she moves or coughs she will have diarrhea.  She has never had C. difficile before.  She does have history of diverticulitis and feels like this may be similar.  She denies any urinary symptoms.  She has had nausea but no vomiting.  She has had no fevers at home.  She has been taking Tylenol at home without relief.   Abdominal Pain      Home Medications Prior to Admission medications   Medication Sig Start Date End Date Taking? Authorizing Provider  cephALEXin (KEFLEX) 500 MG capsule Take 1 capsule (500 mg total) by mouth 2 (two) times daily for 5 days. 06/05/22 06/10/22 Yes Elgie Congo, MD  ondansetron (ZOFRAN-ODT) 4 MG disintegrating tablet Take 1 tablet (4 mg total) by mouth every 8 (eight) hours as needed for nausea or vomiting. 06/05/22  Yes Elgie Congo, MD  potassium chloride SA (KLOR-CON M) 20 MEQ tablet Take 1 tablet (20 mEq total) by mouth 2 (two) times daily for 3 days. 06/05/22 06/08/22 Yes Elgie Congo, MD  vancomycin (VANCOCIN) 125 MG capsule Take 1 capsule (125 mg total) by mouth 4 (four) times daily for 10 days. 06/05/22  06/15/22 Yes Elgie Congo, MD  acetaminophen (TYLENOL) 500 MG tablet Take 1,000 mg by mouth every 4 (four) hours as needed for mild pain.    [provider]  calcium-vitamin D (OSCAL WITH D) 500-200 MG-UNIT per tablet Take 1 tablet by mouth 2 (two) times daily.    [provider]  denosumab (PROLIA) 60 MG/ML SOSY injection Inject 60 mg into the skin every 6 (six) months. May 1st    [provider]  diclofenac sodium (VOLTAREN) 1 % GEL Apply 4 g topically 4 (four) times daily. Patient not taking: Reported on 11/10/2021 12/16/18   Deno Etienne, DO  losartan (COZAAR) 50 MG tablet Take 50 mg by mouth daily. 10/25/21   [provider]  meclizine (ANTIVERT) 25 MG tablet Take 1 tablet (25 mg total) by mouth 3 (three) times daily as needed for dizziness. Patient not taking: Reported on 11/10/2021 07/24/16   Little, Wenda Overland, MD  metoprolol succinate (TOPROL-XL) 50 MG 24 hr tablet Take 50 mg by mouth daily. Blood pressure 07/13/16   [provider]  Multiple Vitamin (MULTIVITAMIN WITH MINERALS) TABS Take 1 tablet by mouth daily. One a Day Women's 50 plus    [provider]  pantoprazole (PROTONIX) 40 MG tablet Take 40 mg by mouth daily. 10/20/15   [provider]  traMADol (ULTRAM) 50 MG tablet Take 50 mg by mouth every 6 (six) hours as needed for pain. 10/25/21  [provider]      Allergies    Fosamax [alendronate sodium], Boniva [ibandronic acid], Evista [raloxifene], Levbid [hyoscyamine], Nexium [esomeprazole magnesium], Norco [hydrocodone-acetaminophen], Prevacid [lansoprazole], and Morphine and related    Review of Systems   Review of Systems  Gastrointestinal:  Positive for abdominal pain.    Physical Exam Updated Vital Signs BP (!) 130/58   Pulse 81   Temp (!) 97.5 F (36.4 C) (Oral)   Resp 16   Ht '4\' 11"'$  (1.499 m)   Wt 56.7 kg   SpO2 93%   BMI 25.25 kg/m  Physical Exam Constitutional: Alert and oriented.   Slightly fatigued but nontoxic Eyes: Conjunctivae are normal. ENT      Head: Normocephalic and atraumatic.      Nose: No congestion.      Mouth/Throat: Mucous membranes are dry.      Neck: No stridor. Cardiovascular: S1, S2, regular rate.Warm and well perfused. Respiratory: Normal respiratory effort.  O2 sat 98 on RA Gastrointestinal: Soft and nondistended with tenderness to palpation in the left lower quadrant with voluntary guarding, not peritonitic Musculoskeletal: Normal range of motion in all extremities. No pitting edema of lower extremities Neurologic: Normal speech and language. No gross focal neurologic deficits are appreciated. Skin: Skin is warm, dry and intact. No rash noted. Psychiatric: Mood and affect are normal. Speech and behavior are normal.  ED Results / Procedures / Treatments   Labs (all labs ordered are listed, but only abnormal results are displayed) Labs Reviewed  C DIFFICILE QUICK SCREEN W PCR REFLEX   - Abnormal; Notable for the following components:      Result Value   C Diff antigen POSITIVE (*)    C Diff toxin POSITIVE (*)    All other components within normal limits  COMPREHENSIVE METABOLIC PANEL - Abnormal; Notable for the following components:   Potassium 3.0 (*)    Glucose, Bld 128 (*)    Albumin 3.3 (*)    All other components within normal limits  CBC - Abnormal; Notable for the following components:   WBC 18.3 (*)    All other components within normal limits  URINALYSIS, ROUTINE W REFLEX MICROSCOPIC - Abnormal; Notable for the following components:   Specific Gravity, Urine 1.042 (*)    Hgb urine dipstick SMALL (*)    Ketones, ur 5 (*)    Leukocytes,Ua LARGE (*)    Non Squamous Epithelial 0-5 (*)    All other components within normal limits  GASTROINTESTINAL PANEL BY PCR, STOOL (REPLACES STOOL CULTURE)  LIPASE, BLOOD    EKG None  Radiology CT ABDOMEN PELVIS W CONTRAST  Result Date: 06/05/2022 CLINICAL DATA:  lower abd pain, h/o  diverticulosis, diarrhea EXAM: CT ABDOMEN AND PELVIS WITH CONTRAST TECHNIQUE: Multidetector CT imaging of the abdomen and pelvis was performed using the standard protocol following bolus administration of intravenous contrast. RADIATION DOSE REDUCTION: This exam was performed according to the departmental dose-optimization program which includes automated exposure control, adjustment of the mA and/or kV according to patient size and/or use of iterative reconstruction technique. CONTRAST:  171m OMNIPAQUE IOHEXOL 300 MG/ML  SOLN COMPARISON:  Nov 08, 2021 FINDINGS: Lower chest: Bibasilar atelectasis. Hepatobiliary: Status post cholecystectomy with unchanged prominence of the biliary system, likely due to post cholecystectomy state. No new focal hepatic lesion. Pancreas: Unremarkable. No pancreatic ductal dilatation or surrounding inflammatory changes. Spleen: Unchanged subcentimeter hypodense mass in the posterior spleen, likely a benign splenic cyst or hemangioma. Adrenals/Urinary Tract: Stable appearance of the adrenal  glands. Kidneys enhance symmetrically. No hydronephrosis. No obstructing nephrolithiasis. Bladder is unremarkable. Stomach/Bowel: No evidence of bowel obstruction. The majority of the transverse and sigmoid colon are decompressed. However there is subjective circumferential bowel wall prominence with submucosal enhancement throughout the near entirety of the colon, most pronounced in the rectosigmoid colon. Diverticulosis without definitive evidence of acute diverticulitis. Appendix is normal. Circumferential wall prominence of the distal esophagus. Vascular/Lymphatic: Atherosclerotic calcifications throughout the nonaneurysmal abdominal aorta. No new lymphadenopathy. Reproductive: Status post hysterectomy. No adnexal masses. Other: No free air or free fluid. Musculoskeletal: Levocurvature of the lumbar spine. IMPRESSION: 1. There is subjective circumferential bowel wall prominence with submucosal  enhancement throughout the near entirety of the colon, most pronounced in the rectosigmoid colon. Findings may reflect a nonspecific infectious or inflammatory colitis in the appropriate clinical setting. 2. Diverticulosis without definitive evidence of acute diverticulitis. 3. Circumferential wall prominence of the distal esophagus, correlate for esophagitis. Aortic Atherosclerosis (ICD10-I70.0). Electronically Signed   By: Valentino Saxon M.D.   On: 06/05/2022 11:51    Procedures Procedures   Medications Ordered in ED Medications  vancomycin (VANCOCIN) capsule 125 mg (has no administration in time range)  potassium chloride SA (KLOR-CON M) CR tablet 60 mEq (has no administration in time range)  lactated ringers bolus 1,000 mL (0 mLs Intravenous Stopped 06/05/22 1200)  ondansetron (ZOFRAN) injection 4 mg (4 mg Intravenous Given 06/05/22 1044)  dicyclomine (BENTYL) capsule 10 mg (10 mg Oral Given 06/05/22 1047)  iohexol (OMNIPAQUE) 300 MG/ML solution 100 mL (100 mLs Intravenous Contrast Given 06/05/22 1124)  cefTRIAXone (ROCEPHIN) 1 g in sodium chloride 0.9 % 100 mL IVPB (0 g Intravenous Stopped 06/05/22 1345)    ED Course/ Medical Decision Making/ A&P Clinical Course as of 06/05/22 1545  Sun Jun 05, 2022  1450 Spoke with Junie Panning the pharmacist recommends Keflex 500 bid for UTI x 5 day and vancomycin tablet 125 mg 4 times daily for the next 10 days. [VB]    Clinical Course User Index [VB] Elgie Congo, MD                           Medical Decision Making Yolanda Phillips is a 88 y.o. female. With pmh IBS, GERD, diverticulosis presenting with lower abdominal pain and diarrhea. Of note, had CT PE study performed 05/13/22 showing consolidation left upper lobe c/f mass vs pneumonia and discharged on 10 days doxycycline.  Based on patient's history and presentation, concern for possible infectious diarrhea such as C. difficile especially in the setting of recent antibiotic use or possible  colitis or diverticulitis especially with location of pain and known diverticulosis.  She presented afebrile, not tachycardic, not hypotensive and no increased work of breathing, unlikely to be septic.  Labs were obtained which I personally reviewed which showed leukocytosis 18.3, normal creatinine 0.74, mild hypokalemia 3.0.  Repleted in the ED.  Lipase within normal limits and no transaminitis with normal total bilirubin 1.2.  UA suggestive of UTI large leukocyte esterase 11-20 WBCs.  CTAP with contrast obtained which I personally reviewed which showed evidence of colitis which is likely C. difficile colitis as patient tested positive for C. difficile today.Spoke with Junie Panning the pharmacist recommends Keflex 500 bid for UTI x 5 day and vancomycin tablet 125 mg 4 times daily for the next 10 days.  I spoke with patient and patient's son at bedside, I offered admission for continued hydration and treatment of UTI and C. difficile.  However patient feels comfortable with going home with help from her son.  She is hemodynamically stable without AKI.  She is tolerating p.o. without nausea or vomiting is able to keep her medications down.  She is not altered and is neurologically intact.  Plan to discharge patient with 5 days of Keflex for UTI, 10 days of vancomycin for C. difficile, Zofran as needed for nausea or vomiting and advise close follow-up with PCP with strict return precautions.  They are in agreement with plan and patient safe for discharge at this time.  Amount and/or Complexity of Data Reviewed Labs: ordered. Radiology: ordered.  Risk Prescription drug management.    Final Clinical Impression(s) / ED Diagnoses Final diagnoses:  Urinary tract infection without hematuria, site unspecified  C. difficile colitis  Hypokalemia  Diarrhea of infectious origin    Rx / DC Orders ED Discharge Orders          Ordered    cephALEXin (KEFLEX) 500 MG capsule  2 times daily        06/05/22  1533    vancomycin (VANCOCIN) 125 MG capsule  4 times daily        06/05/22 1533    ondansetron (ZOFRAN-ODT) 4 MG disintegrating tablet  Every 8 hours PRN        06/05/22 1533    potassium chloride SA (KLOR-CON M) 20 MEQ tablet  2 times daily        06/05/22 1533              Elgie Congo, MD 06/05/22 1545

## 2022-06-09 DIAGNOSIS — E876 Hypokalemia: Secondary | ICD-10-CM | POA: Diagnosis not present

## 2022-06-09 DIAGNOSIS — N390 Urinary tract infection, site not specified: Secondary | ICD-10-CM | POA: Diagnosis not present

## 2022-06-29 DIAGNOSIS — M549 Dorsalgia, unspecified: Secondary | ICD-10-CM | POA: Diagnosis not present

## 2022-06-29 DIAGNOSIS — Z79899 Other long term (current) drug therapy: Secondary | ICD-10-CM | POA: Diagnosis not present

## 2022-06-29 DIAGNOSIS — M25569 Pain in unspecified knee: Secondary | ICD-10-CM | POA: Diagnosis not present

## 2022-06-29 DIAGNOSIS — M1990 Unspecified osteoarthritis, unspecified site: Secondary | ICD-10-CM | POA: Diagnosis not present

## 2022-06-29 DIAGNOSIS — M810 Age-related osteoporosis without current pathological fracture: Secondary | ICD-10-CM | POA: Diagnosis not present

## 2022-06-30 ENCOUNTER — Ambulatory Visit
Admission: RE | Admit: 2022-06-30 | Discharge: 2022-06-30 | Disposition: A | Discharge status: Home / Self Care | Payer: Medicare Other | Source: Ambulatory Visit | Attending: Physician Assistant | Admitting: Physician Assistant

## 2022-06-30 ENCOUNTER — Other Ambulatory Visit: Payer: Self-pay | Admitting: Physician Assistant

## 2022-06-30 DIAGNOSIS — A0472 Enterocolitis due to Clostridium difficile, not specified as recurrent: Secondary | ICD-10-CM

## 2022-06-30 DIAGNOSIS — I10 Essential (primary) hypertension: Secondary | ICD-10-CM | POA: Diagnosis not present

## 2022-06-30 DIAGNOSIS — R197 Diarrhea, unspecified: Secondary | ICD-10-CM | POA: Diagnosis not present

## 2022-06-30 DIAGNOSIS — I700 Atherosclerosis of aorta: Secondary | ICD-10-CM | POA: Diagnosis not present

## 2022-06-30 DIAGNOSIS — N390 Urinary tract infection, site not specified: Secondary | ICD-10-CM | POA: Diagnosis not present

## 2022-06-30 DIAGNOSIS — M419 Scoliosis, unspecified: Secondary | ICD-10-CM | POA: Diagnosis not present

## 2022-06-30 DIAGNOSIS — R109 Unspecified abdominal pain: Secondary | ICD-10-CM

## 2022-06-30 DIAGNOSIS — K589 Irritable bowel syndrome without diarrhea: Secondary | ICD-10-CM | POA: Diagnosis not present

## 2022-06-30 DIAGNOSIS — K8689 Other specified diseases of pancreas: Secondary | ICD-10-CM | POA: Diagnosis not present

## 2022-06-30 DIAGNOSIS — R1032 Left lower quadrant pain: Secondary | ICD-10-CM | POA: Diagnosis not present

## 2022-06-30 DIAGNOSIS — R1031 Right lower quadrant pain: Secondary | ICD-10-CM | POA: Diagnosis not present

## 2022-06-30 MED ORDER — IOPAMIDOL (ISOVUE-300) INJECTION 61%
100.0000 mL | Freq: Once | INTRAVENOUS | Status: AC | PRN
  Administered 2022-06-30: 100 mL via INTRAVENOUS

## 2022-07-02 ENCOUNTER — Other Ambulatory Visit (HOSPITAL_BASED_OUTPATIENT_CLINIC_OR_DEPARTMENT_OTHER): Payer: Self-pay

## 2022-07-02 MED ORDER — DIFICID 200 MG PO TABS
200.0000 mg | ORAL_TABLET | Freq: Two times a day (BID) | ORAL | 0 refills | Status: AC
  Filled 2022-07-02: qty 20, 10d supply, fill #0

## 2022-09-21 DIAGNOSIS — I10 Essential (primary) hypertension: Secondary | ICD-10-CM | POA: Diagnosis not present

## 2022-09-21 DIAGNOSIS — I700 Atherosclerosis of aorta: Secondary | ICD-10-CM | POA: Diagnosis not present

## 2022-09-21 DIAGNOSIS — E7800 Pure hypercholesterolemia, unspecified: Secondary | ICD-10-CM | POA: Diagnosis not present

## 2022-09-21 DIAGNOSIS — R42 Dizziness and giddiness: Secondary | ICD-10-CM | POA: Diagnosis not present

## 2022-09-21 DIAGNOSIS — R9389 Abnormal findings on diagnostic imaging of other specified body structures: Secondary | ICD-10-CM | POA: Diagnosis not present

## 2022-09-23 ENCOUNTER — Other Ambulatory Visit: Payer: Self-pay | Admitting: Family Medicine

## 2022-09-23 DIAGNOSIS — R9389 Abnormal findings on diagnostic imaging of other specified body structures: Secondary | ICD-10-CM

## 2022-10-12 ENCOUNTER — Ambulatory Visit
Admission: RE | Admit: 2022-10-12 | Discharge: 2022-10-12 | Disposition: A | Discharge status: Home / Self Care | Payer: Medicare Other | Source: Ambulatory Visit | Attending: Family Medicine | Admitting: Family Medicine

## 2022-10-12 DIAGNOSIS — R918 Other nonspecific abnormal finding of lung field: Secondary | ICD-10-CM | POA: Diagnosis not present

## 2022-10-12 DIAGNOSIS — I700 Atherosclerosis of aorta: Secondary | ICD-10-CM | POA: Diagnosis not present

## 2022-10-12 DIAGNOSIS — R9389 Abnormal findings on diagnostic imaging of other specified body structures: Secondary | ICD-10-CM

## 2022-10-12 DIAGNOSIS — R931 Abnormal findings on diagnostic imaging of heart and coronary circulation: Secondary | ICD-10-CM | POA: Diagnosis not present

## 2022-10-13 DIAGNOSIS — R059 Cough, unspecified: Secondary | ICD-10-CM | POA: Diagnosis not present

## 2022-10-13 DIAGNOSIS — R9389 Abnormal findings on diagnostic imaging of other specified body structures: Secondary | ICD-10-CM | POA: Diagnosis not present

## 2022-10-13 DIAGNOSIS — R0789 Other chest pain: Secondary | ICD-10-CM | POA: Diagnosis not present

## 2022-10-13 DIAGNOSIS — I10 Essential (primary) hypertension: Secondary | ICD-10-CM | POA: Diagnosis not present

## 2022-10-13 DIAGNOSIS — R42 Dizziness and giddiness: Secondary | ICD-10-CM | POA: Diagnosis not present

## 2022-10-24 DIAGNOSIS — R609 Edema, unspecified: Secondary | ICD-10-CM | POA: Diagnosis not present

## 2022-10-24 DIAGNOSIS — J019 Acute sinusitis, unspecified: Secondary | ICD-10-CM | POA: Diagnosis not present

## 2022-10-24 DIAGNOSIS — R059 Cough, unspecified: Secondary | ICD-10-CM | POA: Diagnosis not present

## 2022-10-24 DIAGNOSIS — I10 Essential (primary) hypertension: Secondary | ICD-10-CM | POA: Diagnosis not present

## 2022-10-27 ENCOUNTER — Ambulatory Visit
Admission: RE | Admit: 2022-10-27 | Discharge: 2022-10-27 | Disposition: A | Discharge status: Home / Self Care | Payer: Medicare Other | Source: Ambulatory Visit | Attending: Family Medicine | Admitting: Family Medicine

## 2022-10-27 ENCOUNTER — Other Ambulatory Visit: Payer: Self-pay | Admitting: Family Medicine

## 2022-10-27 DIAGNOSIS — R1084 Generalized abdominal pain: Secondary | ICD-10-CM | POA: Diagnosis not present

## 2022-10-27 DIAGNOSIS — R109 Unspecified abdominal pain: Secondary | ICD-10-CM

## 2022-10-27 DIAGNOSIS — R1031 Right lower quadrant pain: Secondary | ICD-10-CM | POA: Diagnosis not present

## 2022-10-27 DIAGNOSIS — J019 Acute sinusitis, unspecified: Secondary | ICD-10-CM | POA: Diagnosis not present

## 2022-10-27 DIAGNOSIS — R197 Diarrhea, unspecified: Secondary | ICD-10-CM | POA: Diagnosis not present

## 2022-10-27 DIAGNOSIS — Z0000 Encounter for general adult medical examination without abnormal findings: Secondary | ICD-10-CM | POA: Diagnosis not present

## 2022-12-22 ENCOUNTER — Other Ambulatory Visit: Payer: Self-pay | Admitting: Family Medicine

## 2022-12-22 DIAGNOSIS — R9389 Abnormal findings on diagnostic imaging of other specified body structures: Secondary | ICD-10-CM

## 2022-12-29 DIAGNOSIS — M810 Age-related osteoporosis without current pathological fracture: Secondary | ICD-10-CM | POA: Diagnosis not present

## 2022-12-29 DIAGNOSIS — Z79899 Other long term (current) drug therapy: Secondary | ICD-10-CM | POA: Diagnosis not present

## 2022-12-29 DIAGNOSIS — M1990 Unspecified osteoarthritis, unspecified site: Secondary | ICD-10-CM | POA: Diagnosis not present

## 2022-12-29 DIAGNOSIS — R600 Localized edema: Secondary | ICD-10-CM | POA: Diagnosis not present

## 2023-01-02 DIAGNOSIS — I10 Essential (primary) hypertension: Secondary | ICD-10-CM | POA: Diagnosis not present

## 2023-01-02 DIAGNOSIS — R609 Edema, unspecified: Secondary | ICD-10-CM | POA: Diagnosis not present

## 2023-01-02 DIAGNOSIS — R42 Dizziness and giddiness: Secondary | ICD-10-CM | POA: Diagnosis not present

## 2023-01-13 ENCOUNTER — Ambulatory Visit
Admission: RE | Admit: 2023-01-13 | Discharge: 2023-01-13 | Disposition: A | Discharge status: Home / Self Care | Payer: Medicare Other | Source: Ambulatory Visit | Attending: Family Medicine | Admitting: Family Medicine

## 2023-01-13 DIAGNOSIS — I700 Atherosclerosis of aorta: Secondary | ICD-10-CM | POA: Diagnosis not present

## 2023-01-13 DIAGNOSIS — R911 Solitary pulmonary nodule: Secondary | ICD-10-CM | POA: Diagnosis not present

## 2023-01-13 DIAGNOSIS — R9389 Abnormal findings on diagnostic imaging of other specified body structures: Secondary | ICD-10-CM

## 2023-01-24 DIAGNOSIS — M79606 Pain in leg, unspecified: Secondary | ICD-10-CM | POA: Diagnosis not present

## 2023-02-15 DIAGNOSIS — L820 Inflamed seborrheic keratosis: Secondary | ICD-10-CM | POA: Diagnosis not present

## 2023-02-25 DIAGNOSIS — Z23 Encounter for immunization: Secondary | ICD-10-CM | POA: Diagnosis not present

## 2023-03-22 DIAGNOSIS — Z1231 Encounter for screening mammogram for malignant neoplasm of breast: Secondary | ICD-10-CM | POA: Diagnosis not present

## 2023-04-20 DIAGNOSIS — R079 Chest pain, unspecified: Secondary | ICD-10-CM | POA: Diagnosis not present

## 2023-04-20 DIAGNOSIS — R600 Localized edema: Secondary | ICD-10-CM | POA: Diagnosis not present

## 2023-04-20 DIAGNOSIS — I10 Essential (primary) hypertension: Secondary | ICD-10-CM | POA: Diagnosis not present

## 2023-04-20 DIAGNOSIS — K219 Gastro-esophageal reflux disease without esophagitis: Secondary | ICD-10-CM | POA: Diagnosis not present

## 2023-04-20 DIAGNOSIS — E7800 Pure hypercholesterolemia, unspecified: Secondary | ICD-10-CM | POA: Diagnosis not present

## 2023-04-27 DIAGNOSIS — R079 Chest pain, unspecified: Secondary | ICD-10-CM | POA: Diagnosis not present

## 2023-04-27 DIAGNOSIS — M79605 Pain in left leg: Secondary | ICD-10-CM | POA: Diagnosis not present

## 2023-04-27 DIAGNOSIS — R42 Dizziness and giddiness: Secondary | ICD-10-CM | POA: Diagnosis not present

## 2023-04-27 DIAGNOSIS — M79604 Pain in right leg: Secondary | ICD-10-CM | POA: Diagnosis not present

## 2023-04-27 DIAGNOSIS — I10 Essential (primary) hypertension: Secondary | ICD-10-CM | POA: Diagnosis not present

## 2023-05-08 DIAGNOSIS — M810 Age-related osteoporosis without current pathological fracture: Secondary | ICD-10-CM | POA: Diagnosis not present

## 2023-05-08 DIAGNOSIS — I700 Atherosclerosis of aorta: Secondary | ICD-10-CM | POA: Diagnosis not present

## 2023-05-08 DIAGNOSIS — Z85820 Personal history of malignant melanoma of skin: Secondary | ICD-10-CM | POA: Diagnosis not present

## 2023-05-08 DIAGNOSIS — E7800 Pure hypercholesterolemia, unspecified: Secondary | ICD-10-CM | POA: Diagnosis not present

## 2023-05-08 DIAGNOSIS — R7301 Impaired fasting glucose: Secondary | ICD-10-CM | POA: Diagnosis not present

## 2023-05-08 DIAGNOSIS — R6889 Other general symptoms and signs: Secondary | ICD-10-CM | POA: Diagnosis not present

## 2023-05-08 DIAGNOSIS — K219 Gastro-esophageal reflux disease without esophagitis: Secondary | ICD-10-CM | POA: Diagnosis not present

## 2023-05-08 DIAGNOSIS — I10 Essential (primary) hypertension: Secondary | ICD-10-CM | POA: Diagnosis not present

## 2023-05-08 DIAGNOSIS — Z0000 Encounter for general adult medical examination without abnormal findings: Secondary | ICD-10-CM | POA: Diagnosis not present

## 2023-05-08 DIAGNOSIS — R600 Localized edema: Secondary | ICD-10-CM | POA: Diagnosis not present

## 2023-05-08 DIAGNOSIS — R42 Dizziness and giddiness: Secondary | ICD-10-CM | POA: Diagnosis not present

## 2023-05-17 DIAGNOSIS — I10 Essential (primary) hypertension: Secondary | ICD-10-CM | POA: Diagnosis not present

## 2023-05-17 DIAGNOSIS — R197 Diarrhea, unspecified: Secondary | ICD-10-CM | POA: Diagnosis not present

## 2023-05-17 DIAGNOSIS — R6883 Chills (without fever): Secondary | ICD-10-CM | POA: Diagnosis not present

## 2023-06-02 DIAGNOSIS — I10 Essential (primary) hypertension: Secondary | ICD-10-CM | POA: Diagnosis not present

## 2023-06-02 DIAGNOSIS — I498 Other specified cardiac arrhythmias: Secondary | ICD-10-CM | POA: Diagnosis not present

## 2023-06-02 DIAGNOSIS — R079 Chest pain, unspecified: Secondary | ICD-10-CM | POA: Diagnosis not present

## 2023-06-21 DIAGNOSIS — I498 Other specified cardiac arrhythmias: Secondary | ICD-10-CM | POA: Diagnosis not present

## 2023-06-22 DIAGNOSIS — I498 Other specified cardiac arrhythmias: Secondary | ICD-10-CM | POA: Diagnosis not present

## 2023-06-30 ENCOUNTER — Ambulatory Visit: Payer: Medicare Other | Attending: Cardiology | Admitting: Cardiology

## 2023-06-30 VITALS — BP 162/80 | HR 62 | Ht 59.0 in | Wt 121.8 lb

## 2023-06-30 DIAGNOSIS — I10 Essential (primary) hypertension: Secondary | ICD-10-CM | POA: Diagnosis not present

## 2023-06-30 DIAGNOSIS — R079 Chest pain, unspecified: Secondary | ICD-10-CM

## 2023-06-30 DIAGNOSIS — E7800 Pure hypercholesterolemia, unspecified: Secondary | ICD-10-CM

## 2023-06-30 DIAGNOSIS — R42 Dizziness and giddiness: Secondary | ICD-10-CM

## 2023-06-30 NOTE — Progress Notes (Signed)
Cardiology Office Note:  .   Date:  06/30/2023  ID:  Yolanda Phillips, DOB [birth year], MRN [id number] PCP: Blair Heys, MD (Inactive)  [facility] Providers Cardiologist:  None     History of Present Illness: .   Yolanda Phillips is a 89 y.o. female Discussed with the use of AI scribe  History of Present Illness   The patient, a 89 year old with a history of hypertension, IBS, and GERD, presents with complaints of dizziness and chest discomfort. The patient reports that the dizziness is exacerbated after taking carvedilol, a medication recently prescribed for hypertension. The patient also reports a history of chest discomfort, which has been ongoing for several years. The patient describes the discomfort as a persistent ache, which comes and goes.  The patient has recently stopped taking losartan due to an adverse reaction, which included a significant increase in blood pressure and a sensation of fullness in the ears. The patient also reports a history of taking metoprolol for several years before being taken off the medication.  In addition to the dizziness and chest discomfort, the patient reports experiencing leg pain and cramping, which has not occurred on the day of the consultation. The patient has previously tried taking magnesium for the leg cramps but had to stop due to diarrhea.  The patient has been monitoring her blood pressure at home, which is typically low in the morning but increases as the day progresses. The patient reports that the blood pressure can reach levels as high as 200 over 101, which is concerning.  The patient has recently worn a heart monitor, but the results have not been discussed with her at the time of the consultation. The patient also reports feeling like her heart is "skipping" or "quivering" at times.  The patient's current medication regimen includes carvedilol 12.5mg  for hypertension. The patient has expressed a desire to manage the dizziness,  which she believes is a side effect of the carvedilol.           Studies Reviewed: Marland Kitchen   EKG Interpretation Date/Time:  Friday June 30 2023 11:08:44 EST Ventricular Rate:  62 PR Interval:  136 QRS Duration:  80 QT Interval:  404 QTC Calculation: 410 R Axis:   1  Text Interpretation: Normal sinus rhythm Minimal voltage criteria for LVH, may be normal variant ( R in aVL ) Nonspecific ST and T wave abnormality When compared with ECG of 13-May-2022 14:46, No significant change was found Confirmed by Donato Schultz ([id number]) on 06/30/2023 11:19:32 AM    Results   RADIOLOGY PECT: Negative (06/05/2022)     Risk Assessment/Calculations:           Physical Exam:   VS:  BP (!) 162/80   Pulse 62   Ht 4\' 11"  (1.499 m)   Wt 121 lb 12.8 oz (55.2 kg)   SpO2 97%   BMI 24.60 kg/m    Wt Readings from Last 3 Encounters:  06/30/23 121 lb 12.8 oz (55.2 kg)  06/05/22 125 lb (56.7 kg)  11/08/21 124 lb (56.2 kg)    GEN: Well nourished, well developed in no acute distress NECK: No JVD; No carotid bruits CARDIAC: RRR, no murmurs, no rubs, no gallops RESPIRATORY:  Clear to auscultation without rales, wheezing or rhonchi  ABDOMEN: Soft, non-tender, non-distended EXTREMITIES:  No edema; No deformity   ASSESSMENT AND PLAN: .    Assessment and Plan    Hypertension Long-standing hypertension with recent evening elevations. Current regimen includes carvedilol 12.5  mg, well-tolerated. Losartan was discontinued due to adverse effects. Office readings elevated. Discussed risks of uncontrolled hypertension (stroke, heart attack) and benefits of medication adjustment. Explained losartan may cause dizziness and suggested timing adjustment.  However, blood pressures can be quite labile and we want to avoid overtreatment of blood pressures to make sure that she does not bottom out. -Her symptomatology may be unrelated to blood pressure in and of itself.  Disequilibrium can be common. - Restart losartan at  12.5 mg (half of a 25 mg tablet) with timing adjustment to early morning or later in the day. - Continue carvedilol 12.5 mg. - Monitor blood pressure at home and report significant changes to Dr. Lewie Chamber. - Follow up with Dr. Lewie Chamber to review heart monitor results.  Dizziness Persistent dizziness, particularly post-carvedilol. Symptoms include lightheadedness, unsteadiness, occasional leg weakness. Differential includes medication side effects and potential cardiac causes, under evaluation with heart monitor. Discussed potential neurologist referral if cardiac causes are ruled out. - Review heart monitor results with Dr. Lewie Chamber to rule out cardiac causes. - Consider neurologist referral if cardiac causes are ruled out. - Continue meclizine as needed for vertigo. - orthostatics were performed in clinic and negative  Leg Cramps Intermittent leg cramps and muscle pain, possibly due to dehydration or electrolyte imbalances. Previous magnesium use beneficial but caused diarrhea. Discussed hydration and regular stretching exercises. - Encourage hydration. - Recommend daily leg stretching exercises.  General Health Maintenance Discussed importance of hydration and regular stretching exercises to maintain muscle health and prevent cramps. - Encourage adequate fluid intake. - Recommend daily leg stretching exercises.  Follow-up - Follow up with Dr. Lewie Chamber to review heart monitor results. - Monitor blood pressure at home and report significant changes.              Signed, Donato Schultz, MD

## 2023-06-30 NOTE — Patient Instructions (Signed)
Medication Instructions:  Please restart Losartan. Continue all other medications as listed.  *If you need a refill on your cardiac medications before your next appointment, please call your pharmacy*   Follow-Up: At [facility], you and your health needs are our priority.  As part of our continuing mission to provide you with exceptional heart care, we have created designated Provider Care Teams.  These Care Teams include your primary Cardiologist (physician) and Advanced Practice Providers (APPs -  Physician Assistants and Nurse Practitioners) who all work together to provide you with the care you need, when you need it.  We recommend signing up for the patient portal called "MyChart".  Sign up information is provided on this After Visit Summary.  MyChart is used to connect with patients for Virtual Visits (Telemedicine).  Patients are able to view lab/test results, encounter notes, upcoming appointments, etc.  Non-urgent messages can be sent to your provider as well.   To learn more about what you can do with MyChart, go to [email or web address].    Your next appointment:   Follow up as needed with Dr Anne Fu

## 2023-07-25 ENCOUNTER — Encounter: Payer: Self-pay | Admitting: Neurology

## 2023-07-31 ENCOUNTER — Ambulatory Visit: Payer: Medicare Other | Admitting: Cardiology

## 2023-08-07 DIAGNOSIS — M810 Age-related osteoporosis without current pathological fracture: Secondary | ICD-10-CM | POA: Diagnosis not present

## 2023-08-07 DIAGNOSIS — M549 Dorsalgia, unspecified: Secondary | ICD-10-CM | POA: Diagnosis not present

## 2023-08-07 DIAGNOSIS — Z79899 Other long term (current) drug therapy: Secondary | ICD-10-CM | POA: Diagnosis not present

## 2023-08-07 DIAGNOSIS — M1990 Unspecified osteoarthritis, unspecified site: Secondary | ICD-10-CM | POA: Diagnosis not present

## 2023-08-10 ENCOUNTER — Telehealth: Payer: Self-pay | Admitting: Cardiology

## 2023-08-10 NOTE — Telephone Encounter (Signed)
 Pt c/o BP issue: STAT if pt c/o blurred vision, one-sided weakness or slurred speech  1. What are your last 5 BP readings?  170/93 186/94 169/90    2. Are you having any other symptoms (ex. Dizziness, headache, blurred vision, passed out)? Dizziness   3. What is your BP issue? Patient states that she is having issues with BP spiking in the afternoon time.

## 2023-08-10 NOTE — Telephone Encounter (Signed)
 Pt advised to call PCP.  Explained that she is PRN w/ Dr. Anne Fu and his note says pt should follow up with her PCP to discuss further. Patient verbalized understanding and agreeable to plan.

## 2023-08-11 ENCOUNTER — Other Ambulatory Visit: Payer: Self-pay | Admitting: Family Medicine

## 2023-08-11 DIAGNOSIS — R42 Dizziness and giddiness: Secondary | ICD-10-CM | POA: Diagnosis not present

## 2023-08-11 DIAGNOSIS — E875 Hyperkalemia: Secondary | ICD-10-CM | POA: Diagnosis not present

## 2023-08-11 DIAGNOSIS — I10 Essential (primary) hypertension: Secondary | ICD-10-CM | POA: Diagnosis not present

## 2023-08-15 ENCOUNTER — Ambulatory Visit: Payer: Medicare Other | Attending: Family Medicine | Admitting: Physical Therapy

## 2023-08-15 VITALS — BP 157/77 | HR 63

## 2023-08-15 DIAGNOSIS — R42 Dizziness and giddiness: Secondary | ICD-10-CM | POA: Insufficient documentation

## 2023-08-15 NOTE — Therapy (Unsigned)
 OUTPATIENT PHYSICAL THERAPY VESTIBULAR EVALUATION     Patient Name: Yolanda Phillips MRN: [id number] DOB:1931/01/07, 89 y.o., female Today's Date: 08/16/2023  END OF SESSION:  PT End of Session - 08/16/23 1459     Visit Number 1    Number of Visits 1   pt to call for follow up if needed after neurology appt on 09-06-23   Authorization Type [employer] Medicare    PT Start Time 1021    PT Stop Time 1101    PT Time Calculation (min) 40 min    Activity Tolerance Patient tolerated treatment well    Behavior During Therapy WFL for tasks assessed/performed             Past Medical History:  Diagnosis Date   Arthritis    Cancer (HCC)    removed skin cancer from her arm   Chronic back pain    Costochondritis    Diverticulosis    Diverticulosis    GERD (gastroesophageal reflux disease)    Hypercholesterolemia    IBS (irritable bowel syndrome)    Kidney stone    Osteoporosis    PONV (postoperative nausea and vomiting)    Past Surgical History:  Procedure Laterality Date   ABDOMINAL HYSTERECTOMY     BREAST SURGERY Bilateral    implants   CHOLECYSTECTOMY     COLONOSCOPY     EYE SURGERY     bilateral cataracts   FACIAL COSMETIC SURGERY     20 + years ago   KYPHOPLASTY N/A 06/18/2015   Procedure: KYPHOPLASTY;  Surgeon: Estill Bamberg, MD;  Location: [facility] OR;  Service: Orthopedics;  Laterality: N/A;  Thoracic 9 kyphoplasty   MELANOMA EXCISION Right 01/14/2013   Procedure: WIDE LOCAL EXCISION RIGHT WRIST MELANOMA ;  Surgeon: Lodema Pilot, DO;  Location: [facility] OR;  Service: General;  Laterality: Right;   TONSILLECTOMY     Patient Active Problem List   Diagnosis Date Noted   Atypical chest pain 03/29/2013   Chest pain 03/26/2013   GERD (gastroesophageal reflux disease)    Osteoporosis    Diverticulosis    PONV (postoperative nausea and vomiting)    Costochondritis    IBS (irritable bowel syndrome)     PCP: Blair Heys, MD REFERRING PROVIDER: Lupita Raider, MD  REFERRING DIAG:   Diagnosis  R42 (ICD-10-CM) - Dizziness and giddiness    THERAPY DIAG:  Dizziness and giddiness  ONSET DATE: December 2024:  Referral date 08-11-23  Rationale for Evaluation and Treatment: Rehabilitation  SUBJECTIVE:   SUBJECTIVE STATEMENT: Pt reports she thinks her dizziness is related to side effects of rather new BP medication;  Pt reports dizziness started getting much worse around 1st of December when she started taking this new blood pressure medication - feels "foggy"; reports the dizziness is mostly constant but varies in intensity; gets worse as day progresses Pt accompanied by: self  PERTINENT HISTORY: Osteoporosis, IBS, HTN  PAIN:  Are you having pain? No  PRECAUTIONS: None  RED FLAGS: None   WEIGHT BEARING RESTRICTIONS: No  FALLS: Has patient fallen in last 6 months? No  LIVING ENVIRONMENT: Lives with: lives alone Lives in: House/apartment  PLOF: Independent  PATIENT GOALS: unsure - thinks she needs to wait for PT until neurology appt is completed on 09-06-23  OBJECTIVE:  Note: Objective measures were completed at Evaluation unless otherwise noted.   Vitals:   08/15/23 1050  BP: (!) 157/77  Pulse: 63    DIAGNOSTIC FINDINGS: N/A for dizziness - pt is scheduled  to have GI CT on 09-04-23; scheduled for neurology consult on 09-06-23  COGNITION: Overall cognitive status: Within functional limits for tasks assessed    POSTURE:  No Significant postural limitations  Cervical ROM:  WFL's    TRANSFERS: Assistive device utilized: None - pt reports she has a SPC which she uses at times - did not bring to appt. today Sit to stand: Modified independence Stand to sit: Modified independence   GAIT: Gait pattern: [facility] Distance walked: 31' Assistive device utilized: None Level of assistance: Modified independence Comments: pt uses SPC occasionally - says she left it in her car today  VESTIBULAR ASSESSMENT:  GENERAL OBSERVATION: pt is a 89 yr old lady  with c/o constant dizziness of unknown etiology at this time;  pt has initial appt with neurology on 09-06-23 (Dr. Patrcia Dolly)   SYMPTOM BEHAVIOR:  Subjective history: pt reports dizziness has progressively worsened since she started taking new BP medication approx. In early Dec.;  pt reports the dizziness increases as day progresses; pt denies spinning vertigo but states she feels like she is "in a fog or has had too many glasses of wine" and states "and I don't drink"  Non-Vestibular symptoms: changes in hearing and tinnitus  Type of dizziness: Imbalance (Disequilibrium) and "feeling like I have a hangover"  Frequency: every day - intensity  Duration: all day  - "til I sleep I off" - progressively worsens as day goes on  Aggravating factors: Induced by motion: occur when walking and turning head quickly  Relieving factors:  meclizine helps sometimes but not always - sitting; early in the morning   Progression of symptoms: worse  OCULOMOTOR EXAM:  Ocular Alignment: normal  Ocular ROM: No Limitations  Spontaneous Nystagmus: absent  Gaze-Induced Nystagmus: absent  Smooth Pursuits: intact  Saccades: intact  MOTION SENSITIVITY:  Motion Sensitivity Quotient Intensity: 0 = none, 1 = Lightheaded, 2 = Mild, 3 = Moderate, 4 = Severe, 5 = Vomiting  Intensity  1. Sitting to supine   2. Supine to L side   3. Supine to R side   4. Supine to sitting   5. L Hallpike-Dix   6. Up from L    7. R Hallpike-Dix   8. Up from R    9. Sitting, head tipped to L knee   10. Head up from L knee   11. Sitting, head tipped to R knee   12. Head up from R knee   13. Sitting head turns x5   14.Sitting head nods x5   15. In stance, 180 turn to L    16. In stance, 180 turn to R                                                                                                                              TREATMENT DATE: Eval only - pt requests to hold PT until after neurology appt has been completed (end of  March) -  as etiology of dizziness is unknown at this time and doesn't appear to be of true vestibular system dysfunction   PATIENT EDUCATION: Education details: discussed eval findings with peripheral vs. Central vertigo explained to pt; etiology of dizziness is unknown at this time, with pt attributing dizziness to side effect of new BP medication which she started taking in early December; dizziness does not appear to be of vestibular system dysfunction - pt does have elevated BP during today's evaluation session Person educated: Patient Education method: Explanation Education comprehension: verbalized understanding  HOME EXERCISE PROGRAM:  N/A at this time -   GOALS:  N/A at this time - pt was informed that she may return for follow up if needed pending neurology appt at end of March - if dizziness is related to medication side effects PT is not warranted at this time as non-vestibular in etiology   SHORT TERM GOALS: Target date: N/A - will be established if pt returns to PT pending vestibular diagnosis   LONG TERM GOALS: Target date: N/A - see above  ASSESSMENT:  CLINICAL IMPRESSION: Patient is a 89 y.o. lady who was seen today for physical therapy evaluation and treatment for dizziness of unknown etiology.  Pt does have elevated BP at today's eval session - she attributes dizziness to side effect of new BP medication which she started taking in early Dec.  Pt has appt for neurology consult on 09-06-23 - will hold PT until this appt is completed pending diagnosis and further workup to determine etiology of dizziness.  Pt declines PT for balance training at this time, stating if dizziness was reduced then her balance would be improved.  Pt is currently ambulating without a device at today's eval session.  Will follow up with pt at end of March, after imaging and neurology appt. And will schedule PT appt at that time if pt requests to do so.    OBJECTIVE IMPAIRMENTS: dizziness.   ACTIVITY  LIMITATIONS: locomotion level  PARTICIPATION LIMITATIONS: community activity  PERSONAL FACTORS: Age, Time since onset of injury/illness/exacerbation, and unknown etiology of dizziness  are also affecting patient's functional outcome.   REHAB POTENTIAL: Good  CLINICAL DECISION MAKING: Evolving/moderate complexity  EVALUATION COMPLEXITY: Moderate   PLAN:  PT FREQUENCY: one time visit  PT DURATION: 1 week  PLANNED INTERVENTIONS: 97110-Therapeutic exercises, 97530- Therapeutic activity, [id number]- Neuromuscular re-education, 97535- Self Care, and 16109- Gait training  PLAN FOR NEXT SESSION: will set goals if pt returns for follow up PT   Sabra Sessler, Donavan Burnet, PT 08/16/2023, 3:00 PM

## 2023-08-16 ENCOUNTER — Encounter: Payer: Self-pay | Admitting: Physical Therapy

## 2023-08-23 ENCOUNTER — Encounter: Payer: Self-pay | Admitting: Neurology

## 2023-08-23 ENCOUNTER — Ambulatory Visit: Admitting: Neurology

## 2023-08-23 VITALS — BP 188/97 | HR 68 | Ht 59.0 in | Wt 125.4 lb

## 2023-08-23 DIAGNOSIS — R42 Dizziness and giddiness: Secondary | ICD-10-CM | POA: Diagnosis not present

## 2023-08-23 DIAGNOSIS — M79604 Pain in right leg: Secondary | ICD-10-CM | POA: Diagnosis not present

## 2023-08-23 DIAGNOSIS — M79605 Pain in left leg: Secondary | ICD-10-CM

## 2023-08-23 DIAGNOSIS — I10 Essential (primary) hypertension: Secondary | ICD-10-CM

## 2023-08-23 NOTE — Patient Instructions (Signed)
 Good to meet you.  Instead of doing a CT scan, we will send an order for brain MRI without contrast  2. Bloodwork from PCP office will be requested for review  3. Continue monitoring BP at home to follow-up with PCP  4. Our office will call with MRI results, if normal, follow-up as needed

## 2023-08-23 NOTE — Progress Notes (Signed)
 NEUROLOGY CONSULTATION NOTE  Yolanda Phillips MRN: [id number] DOB: 1930/12/18  Referring provider: Dr. Irven Coe Primary care provider: Dr. Blair Heys  Reason for consult:  dizziness  Dear Dr Lewie Chamber:  Thank you for your kind referral of Yolanda Phillips for consultation of the above symptoms. Although her history is well known to you, please allow me to reiterate it for the purpose of our medical record. Records and images were personally reviewed where available.   HISTORY OF PRESENT ILLNESS: This is a pleasant 89 year old right-handed woman with a history of hypertension, osteoporosis, presenting for evaluation of dizziness. Symptoms started a year ago. She feels spacey right now, "like I drank too much wine." Symptoms come and go, sometimes her head feels funny with just a little pain. If she stands up, symptoms are worse. If she lies down and is very stationary, she feels okay. She reports sitting up in the office today and feeling a little dizzy. She states she is not dizzy when driving. She denies any lightheaded or spinning sensation. She denies any diplopia, dysarthria/dysphagia, focal numbness/tingling/weakness. No falls in the past year. She has been having horrible pain in her legs, she reports it was very severe this past month but last week it has been better. Walking and hydrating helps after a while. She reports legs are so sore, they swell. She describes it as a cramp or ache, no paresthesias or numbness. Pain is mostly below the knees, one time it was up to her thighs and attributed to her back. She notes that an over the counter spray seems to help with her legs. She has occasional back pain and has been told she has arthritis on the right side. She has IBS and tends to have diarrhea. Her right hip also bothers her.    PAST MEDICAL HISTORY: Past Medical History:  Diagnosis Date   Arthritis    Cancer (HCC)    removed skin cancer from her arm   Chronic back pain     Costochondritis    Diverticulosis    Diverticulosis    GERD (gastroesophageal reflux disease)    Hypercholesterolemia    IBS (irritable bowel syndrome)    Kidney stone    Osteoporosis    PONV (postoperative nausea and vomiting)     PAST SURGICAL HISTORY: Past Surgical History:  Procedure Laterality Date   ABDOMINAL HYSTERECTOMY     BREAST SURGERY Bilateral    implants   CHOLECYSTECTOMY     COLONOSCOPY     EYE SURGERY     bilateral cataracts   FACIAL COSMETIC SURGERY     20 + years ago   KYPHOPLASTY N/A 06/18/2015   Procedure: KYPHOPLASTY;  Surgeon: Estill Bamberg, MD;  Location: [facility] OR;  Service: Orthopedics;  Laterality: N/A;  Thoracic 9 kyphoplasty   MELANOMA EXCISION Right 01/14/2013   Procedure: WIDE LOCAL EXCISION RIGHT WRIST MELANOMA ;  Surgeon: Lodema Pilot, DO;  Location: [facility] OR;  Service: General;  Laterality: Right;   TONSILLECTOMY      MEDICATIONS: Current Outpatient Medications on File Prior to Visit  Medication Sig Dispense Refill   acetaminophen (TYLENOL) 500 MG tablet Take 1,000 mg by mouth every 4 (four) hours as needed for mild pain.     calcium-vitamin D (OSCAL WITH D) 500-200 MG-UNIT per tablet Take 1 tablet by mouth 2 (two) times daily.     carvedilol (COREG) 12.5 MG tablet Take 12.5 mg by mouth 2 (two) times daily.  Cholecalciferol (VITAMIN D-3) 25 MCG (1000 UT) CAPS Take by mouth daily.     denosumab (PROLIA) 60 MG/ML SOSY injection Inject 60 mg into the skin every 6 (six) months. May 1st     dicyclomine (BENTYL) 10 MG capsule Take 10 mg by mouth once as needed for spasms.     losartan (COZAAR) 25 MG tablet Take 12.5 mg by mouth daily.     meclizine (ANTIVERT) 25 MG tablet Take 1 tablet (25 mg total) by mouth 3 (three) times daily as needed for dizziness. 8 tablet 0   Multiple Vitamin (MULTIVITAMIN WITH MINERALS) TABS Take 1 tablet by mouth daily. One a Day Women's 50 plus     pantoprazole (PROTONIX) 40 MG tablet Take 40 mg by mouth every other day.      vitamin B-12 (CYANOCOBALAMIN) 500 MCG tablet Take 500 mcg by mouth daily.     No current facility-administered medications on file prior to visit.    ALLERGIES: Allergies  Allergen Reactions   Fosamax [Alendronate Sodium] Other (See Comments)    GERDS   Boniva [Ibandronate] Diarrhea   Evista [Raloxifene] Diarrhea   Levbid [Hyoscyamine] Other (See Comments)    Cramps   Nexium [Esomeprazole Magnesium] Diarrhea   Norco [Hydrocodone-Acetaminophen] Other (See Comments)    Severe constipation    Prevacid [Lansoprazole] Diarrhea   Morphine And Codeine Other (See Comments)    Made patient very dizzy    FAMILY HISTORY: Family History  Problem Relation Age of Onset   Multiple myeloma Mother    Hypertension Father     SOCIAL HISTORY: Social History   Socioeconomic History   Marital status: Single    Spouse name: Not on file   Number of children: Not on file   Years of education: Not on file   Highest education level: Not on file  Occupational History   Not on file  Tobacco Use   Smoking status: Never   Smokeless tobacco: Never  Vaping Use   Vaping status: Never Used  Substance and Sexual Activity   Alcohol use: No   Drug use: No   Sexual activity: Not on file  Other Topics Concern   Not on file  Social History Narrative   Are you right handed or left handed? Right    Are you currently employed ? Retired    What is your current occupation?   Do you live at home alone? Alone    Who lives with you? NA   What type of home do you live in: 1 story or 2 story? 1 story        Social Drivers of Corporate investment banker Strain: Not on file  Food Insecurity: Not on file  Transportation Needs: Not on file  Physical Activity: Not on file  Stress: Not on file  Social Connections: Not on file  Intimate Partner Violence: Not on file     PHYSICAL EXAM: Vitals:   08/23/23 1028 08/23/23 1032  BP: (!) 185/80 (!) 188/97  Pulse: 67 68  SpO2: 97% 97%    General: No  acute distress Head:  Normocephalic/atraumatic Skin/Extremities: No rash, no edema Neurological Exam: Mental status: alert and awake, no dysarthria or aphasia, Fund of knowledge is appropriate.  Attention and concentration are normal.     Cranial nerves: CN I: not tested CN II: pupils equal, round, visual fields intact CN III, IV, VI:  full range of motion, no nystagmus, no ptosis CN V: facial sensation intact CN VII: upper  and lower face symmetric CN VIII: hearing intact to conversation CN XI: sternocleidomastoid and trapezius muscles intact CN XII: tongue midline Bulk & Tone: normal, no fasciculations. Motor: 5/5 throughout with no pronator drift. Sensation: intact to light touch, cold, pin on both UE and LE, decreased vibration sense to knees bilaterally, R>L.  Reports soreness of both leg to touch. No extinction to double simultaneous stimulation.  Romberg test negative Deep Tendon Reflexes: brisk +3 on both UE with +Hoffman sign on right, hyperactive pectoralis reflex on left. +2 both LE, no ankle clonus Plantar responses: mute Cerebellar: no incoordination on finger to nose testing Gait: slow and cautious, no ataxia.  Tremor: none No postural instability  IMPRESSION: This is a pleasant 89 year old right-handed woman with a history of hypertension, osteoporosis, presenting for evaluation of dizziness. She reports a sensation of feeling spacey, worse when standing. Her neurological exam shows mild neuropathy as well as brisk reflexes. Etiology of symptoms unclear, a head CT has been ordered but I discussed doing a brain MRI without contrast to assess for underlying structural abnormality. Bloodwork from PCP will be requested for review, if not done, we will order a TSH and B12. Discussed BP today is 188/97, this may be contributing to her symptoms as well, continue to monitor BP at home and with PCP. Our office will call with MRI results, if normal, follow-up as needed.   Thank you for  allowing me to participate in the care of this patient. Please do not hesitate to call for any questions or concerns.   Patrcia Dolly, M.D.  CC: Dr. Lewie Chamber, Dr. Manus Gunning

## 2023-08-30 ENCOUNTER — Encounter: Payer: Self-pay | Admitting: Neurology

## 2023-09-04 ENCOUNTER — Encounter: Payer: Self-pay | Admitting: Neurology

## 2023-09-04 ENCOUNTER — Other Ambulatory Visit

## 2023-09-06 ENCOUNTER — Ambulatory Visit: Payer: Medicare Other | Admitting: Neurology

## 2023-09-09 ENCOUNTER — Ambulatory Visit
Admission: RE | Admit: 2023-09-09 | Discharge: 2023-09-09 | Disposition: A | Discharge status: Home / Self Care | Source: Ambulatory Visit | Attending: Neurology | Admitting: Neurology

## 2023-09-09 DIAGNOSIS — I10 Essential (primary) hypertension: Secondary | ICD-10-CM

## 2023-09-09 DIAGNOSIS — R42 Dizziness and giddiness: Secondary | ICD-10-CM

## 2023-09-09 DIAGNOSIS — M79604 Pain in right leg: Secondary | ICD-10-CM

## 2023-09-18 ENCOUNTER — Telehealth: Payer: Self-pay | Admitting: Neurology

## 2023-09-18 DIAGNOSIS — I10 Essential (primary) hypertension: Secondary | ICD-10-CM | POA: Diagnosis not present

## 2023-09-18 NOTE — Telephone Encounter (Signed)
 Calling for results from scan

## 2023-09-18 NOTE — Telephone Encounter (Signed)
 Pt called an informed that once we get her results I will call her back

## 2023-09-28 ENCOUNTER — Telehealth: Payer: Self-pay | Admitting: Neurology

## 2023-09-28 ENCOUNTER — Telehealth: Payer: Self-pay

## 2023-09-28 NOTE — Telephone Encounter (Signed)
 Pt. Awaiting MRI call

## 2023-09-28 NOTE — Telephone Encounter (Signed)
 See previous encounter. I have called to escalate reading of MRI results.

## 2023-09-28 NOTE — Telephone Encounter (Signed)
 Called and spoke to MJ at [facility] and they will get patients MRI brain escalated to be read.   Patient aware we will contact her once results are in.

## 2023-10-04 NOTE — Telephone Encounter (Signed)
 See Result note, patient called.

## 2023-10-04 NOTE — Telephone Encounter (Signed)
 Pt. Cld about a result discussion on Mri completed on 09/09/23

## 2023-10-04 NOTE — Telephone Encounter (Signed)
 Pls f/u again with [facility], study was done 3/29, thanks

## 2023-10-13 DIAGNOSIS — K080 Exfoliation of teeth due to systemic causes: Secondary | ICD-10-CM | POA: Diagnosis not present

## 2023-11-03 DIAGNOSIS — R7301 Impaired fasting glucose: Secondary | ICD-10-CM | POA: Diagnosis not present

## 2023-11-03 DIAGNOSIS — I10 Essential (primary) hypertension: Secondary | ICD-10-CM | POA: Diagnosis not present

## 2023-11-07 DIAGNOSIS — R42 Dizziness and giddiness: Secondary | ICD-10-CM | POA: Diagnosis not present

## 2023-11-07 DIAGNOSIS — R2689 Other abnormalities of gait and mobility: Secondary | ICD-10-CM | POA: Diagnosis not present

## 2023-11-07 DIAGNOSIS — I10 Essential (primary) hypertension: Secondary | ICD-10-CM | POA: Diagnosis not present

## 2023-11-07 DIAGNOSIS — M810 Age-related osteoporosis without current pathological fracture: Secondary | ICD-10-CM | POA: Diagnosis not present

## 2023-11-14 DIAGNOSIS — R29890 Loss of height: Secondary | ICD-10-CM | POA: Diagnosis not present

## 2023-11-14 DIAGNOSIS — M8588 Other specified disorders of bone density and structure, other site: Secondary | ICD-10-CM | POA: Diagnosis not present

## 2023-12-05 DIAGNOSIS — H903 Sensorineural hearing loss, bilateral: Secondary | ICD-10-CM | POA: Diagnosis not present

## 2023-12-19 DIAGNOSIS — H26492 Other secondary cataract, left eye: Secondary | ICD-10-CM | POA: Diagnosis not present

## 2023-12-19 DIAGNOSIS — H35033 Hypertensive retinopathy, bilateral: Secondary | ICD-10-CM | POA: Diagnosis not present

## 2023-12-19 DIAGNOSIS — H35373 Puckering of macula, bilateral: Secondary | ICD-10-CM | POA: Diagnosis not present

## 2023-12-19 DIAGNOSIS — Z961 Presence of intraocular lens: Secondary | ICD-10-CM | POA: Diagnosis not present

## 2024-01-24 DIAGNOSIS — D6869 Other thrombophilia: Secondary | ICD-10-CM | POA: Diagnosis not present

## 2024-02-05 DIAGNOSIS — M1990 Unspecified osteoarthritis, unspecified site: Secondary | ICD-10-CM | POA: Diagnosis not present

## 2024-02-05 DIAGNOSIS — Z79899 Other long term (current) drug therapy: Secondary | ICD-10-CM | POA: Diagnosis not present

## 2024-02-05 DIAGNOSIS — M549 Dorsalgia, unspecified: Secondary | ICD-10-CM | POA: Diagnosis not present

## 2024-02-05 DIAGNOSIS — M810 Age-related osteoporosis without current pathological fracture: Secondary | ICD-10-CM | POA: Diagnosis not present

## 2024-03-15 DIAGNOSIS — I10 Essential (primary) hypertension: Secondary | ICD-10-CM | POA: Diagnosis not present

## 2024-03-15 DIAGNOSIS — R197 Diarrhea, unspecified: Secondary | ICD-10-CM | POA: Diagnosis not present

## 2024-03-15 DIAGNOSIS — K589 Irritable bowel syndrome without diarrhea: Secondary | ICD-10-CM | POA: Diagnosis not present

## 2024-03-27 DIAGNOSIS — Z1231 Encounter for screening mammogram for malignant neoplasm of breast: Secondary | ICD-10-CM | POA: Diagnosis not present

## 2024-03-30 DIAGNOSIS — Z23 Encounter for immunization: Secondary | ICD-10-CM | POA: Diagnosis not present

## 2024-05-12 ENCOUNTER — Emergency Department (HOSPITAL_BASED_OUTPATIENT_CLINIC_OR_DEPARTMENT_OTHER)
Admission: EM | Admit: 2024-05-12 | Discharge: 2024-05-12 | Disposition: A | Discharge status: Home / Self Care | Attending: Emergency Medicine | Admitting: Emergency Medicine

## 2024-05-12 ENCOUNTER — Emergency Department (HOSPITAL_BASED_OUTPATIENT_CLINIC_OR_DEPARTMENT_OTHER)

## 2024-05-12 ENCOUNTER — Other Ambulatory Visit: Payer: Self-pay

## 2024-05-12 DIAGNOSIS — R519 Headache, unspecified: Secondary | ICD-10-CM

## 2024-05-12 DIAGNOSIS — I6502 Occlusion and stenosis of left vertebral artery: Secondary | ICD-10-CM | POA: Diagnosis not present

## 2024-05-12 DIAGNOSIS — R030 Elevated blood-pressure reading, without diagnosis of hypertension: Secondary | ICD-10-CM

## 2024-05-12 DIAGNOSIS — I10 Essential (primary) hypertension: Secondary | ICD-10-CM | POA: Diagnosis not present

## 2024-05-12 DIAGNOSIS — R079 Chest pain, unspecified: Secondary | ICD-10-CM

## 2024-05-12 DIAGNOSIS — R42 Dizziness and giddiness: Secondary | ICD-10-CM | POA: Diagnosis not present

## 2024-05-12 DIAGNOSIS — R0789 Other chest pain: Secondary | ICD-10-CM | POA: Diagnosis not present

## 2024-05-12 DIAGNOSIS — Z79899 Other long term (current) drug therapy: Secondary | ICD-10-CM | POA: Insufficient documentation

## 2024-05-12 DIAGNOSIS — I77819 Aortic ectasia, unspecified site: Secondary | ICD-10-CM | POA: Diagnosis not present

## 2024-05-12 DIAGNOSIS — I517 Cardiomegaly: Secondary | ICD-10-CM | POA: Diagnosis not present

## 2024-05-12 DIAGNOSIS — R9082 White matter disease, unspecified: Secondary | ICD-10-CM | POA: Diagnosis not present

## 2024-05-12 DIAGNOSIS — I672 Cerebral atherosclerosis: Secondary | ICD-10-CM | POA: Diagnosis not present

## 2024-05-12 DIAGNOSIS — I700 Atherosclerosis of aorta: Secondary | ICD-10-CM | POA: Diagnosis not present

## 2024-05-12 DIAGNOSIS — I6523 Occlusion and stenosis of bilateral carotid arteries: Secondary | ICD-10-CM | POA: Diagnosis not present

## 2024-05-12 LAB — COMPREHENSIVE METABOLIC PANEL WITH GFR
ALT: 9 U/L (ref 0–44)
AST: 19 U/L (ref 15–41)
Albumin: 4.3 g/dL (ref 3.5–5.0)
Alkaline Phosphatase: 52 U/L (ref 38–126)
Anion gap: 12 (ref 5–15)
BUN: 16 mg/dL (ref 8–23)
CO2: 25 mmol/L (ref 22–32)
Calcium: 9.8 mg/dL (ref 8.9–10.3)
Chloride: 105 mmol/L (ref 98–111)
Creatinine, Ser: 0.69 mg/dL (ref 0.44–1.00)
GFR, Estimated: >60 mL/min (ref >60)
Glucose, Bld: 110 mg/dL — ABNORMAL HIGH (ref 70–99)
Potassium: 4.2 mmol/L (ref 3.5–5.1)
Sodium: 141 mmol/L (ref 135–145)
Total Bilirubin: 0.4 mg/dL (ref 0.0–1.2)
Total Protein: 7.5 g/dL (ref 6.5–8.1)

## 2024-05-12 LAB — CBC
HCT: 42.4 % (ref 36.0–46.0)
Hemoglobin: 13.8 g/dL (ref 12.0–15.0)
MCH: 28.9 pg (ref 26.0–34.0)
MCHC: 32.5 g/dL (ref 30.0–36.0)
MCV: 88.7 fL (ref 80.0–100.0)
Platelets: 352 K/uL (ref 150–400)
RBC: 4.78 MIL/uL (ref 3.87–5.11)
RDW: 12.6 % (ref 11.5–15.5)
WBC: 7.4 K/uL (ref 4.0–10.5)
nRBC: 0 % (ref 0.0–0.2)

## 2024-05-12 LAB — TROPONIN T, HIGH SENSITIVITY
Troponin T High Sensitivity: <15 ng/L (ref 0–19)
Troponin T High Sensitivity: <15 ng/L (ref 0–19)

## 2024-05-12 MED ORDER — IOHEXOL 350 MG/ML SOLN
75.0000 mL | Freq: Once | INTRAVENOUS | Status: AC | PRN
  Administered 2024-05-12: 75 mL via INTRAVENOUS

## 2024-05-12 NOTE — ED Provider Notes (Signed)
 I provided a substantive portion of the care of this patient.  I personally made/approved the management plan for this patient and take responsibility for the patient management.  EKG Interpretation Date/Time:  Sunday May 12 2024 16:51:47 EST Ventricular Rate:  69 PR Interval:  147 QRS Duration:  88 QT Interval:  416 QTC Calculation: 446 R Axis:   9  Text Interpretation: Sinus rhythm Posterior infarct, old Borderline ST depression, anterolateral leads Confirmed by Dasie Faden ([id number]) on 05/12/2024 6:11:42 PM   Nine 89-year-old female who presents due to increased blood pressure and dizziness.  Labs here are reassuring.  CT angio head and neck which.  Blood pressure here slightly elevated.  Patient will see her doctor next week for blood pressure management as patient told me that her doctor has been adjusting her medications for the past 6 months   Dasie Faden, MD 05/12/24 1958

## 2024-05-12 NOTE — ED Triage Notes (Signed)
 Pt reports HTN and dizziness for x3 days. Hx HTN, compliant w/ meds. Denies CP/SHOB. Endorses some indigestion this morning. Pt states 'my head feels funny'

## 2024-05-12 NOTE — ED Notes (Signed)
 Pt d/c instructions, medications, and follow-up care reviewed with pt and family. Pt verbalized understanding and had no further questions at time of d/c. Pt CA&Ox4, ambulatory, and in NAD at time of d/c

## 2024-05-12 NOTE — ED Provider Notes (Signed)
  Physical Exam  BP (!) 182/83 (BP Location: Right Arm)   Pulse 70   Temp 98.5 F (36.9 C) (Oral)   Resp 16   Ht 4' 10 (1.473 m)   Wt 55.3 kg   SpO2 100%   BMI 25.50 kg/m   Physical Exam Vitals and nursing note reviewed.  Constitutional:      General: She is not in acute distress.    Appearance: She is well-developed.  HENT:     Head: Normocephalic and atraumatic.  Eyes:     Conjunctiva/sclera: Conjunctivae normal.  Cardiovascular:     Rate and Rhythm: Normal rate and regular rhythm.     Heart sounds: No murmur heard. Pulmonary:     Effort: Pulmonary effort is normal.  Abdominal:     Palpations: Abdomen is soft.     Tenderness: There is no abdominal tenderness.  Musculoskeletal:        General: No swelling.     Cervical back: Neck supple.  Skin:    General: Skin is warm and dry.     Capillary Refill: Capillary refill takes less than 2 seconds.  Neurological:     Mental Status: She is alert and oriented to person, place, and time.  Psychiatric:        Mood and Affect: Mood normal.     Procedures  Procedures  ED Course / MDM   Clinical Course as of 05/12/24 1904  Sun May 12, 2024  1722 BP rechecked during initial evaluation. 198/90.  [CA]  1742 BP improved to 182/83 [CA]  1802 BP improved to 162/127 [CA]    Clinical Course User Index [CA] Lorelle Aleck BROCKS, PA-C   Medical Decision Making Amount and/or Complexity of Data Reviewed Labs: ordered. Radiology: ordered.  Risk Prescription drug management.   Signout taken from [name], PA-C. Patient presents with high blood pressure with vague symptoms of dizziness and indigestion.  CT angio of head and neck were reassuring, chest x-ray showed no acute findings.  Blood pressure improved to 160s systolic after taking her home p.o. meds.  Pending second troponin and plan to discharge if this is normal.  Patient also to be seen by Dr. Dasie.   Patient also seen by Dr. Dasie in the ED, had a  reassuring workup including 2 negative troponins, she did not having any chest pain or shortness of breath.  She had no focal findings on neurologic exam.  She is going to follow closely for blood pressure medication management, and was advised on strict return precautions.       Yolanda Phillips 05/12/24 2027    Dasie Faden, MD 05/14/24 [phone]

## 2024-05-12 NOTE — Discharge Instructions (Signed)
 You are seen today for high blood pressure.  Fortunately your workup was very reassuring.  Call your doctor tomorrow to schedule close follow-up for blood pressure medication adjustment.

## 2024-05-12 NOTE — ED Provider Notes (Signed)
 [place] [facility] AT [facility] Provider Note   CSN: [id number] Arrival date & time: 05/12/24  8367     Patient presents with: Hypertension   Yolanda Phillips is a 89 y.o. female with a past medical history significant for hypertension, GERD, and IBS who presents to the ED due to elevated blood pressure and dizziness x 3 days.  Notes her blood pressure has been running between 170-190s.  Currently on carvedilol and losartan which she has been compliant with.  Patient notes her head feels heavy.  Notes she has to keep lifting her head.  Describes dizziness as feeling lightheaded.  Denies any visual or speech changes.  No unilateral weakness.  Notes she had a episode of indigestion this morning.  No current chest pain.  Follows cardiology.  History obtained from patient and past medical records. No interpreter used during encounter.       Prior to Admission medications   Medication Sig Start Date End Date Taking? Authorizing Provider  acetaminophen  (TYLENOL ) 500 MG tablet Take 1,000 mg by mouth every 4 (four) hours as needed for mild pain.    [provider]  calcium-vitamin D (OSCAL WITH D) 500-200 MG-UNIT per tablet Take 1 tablet by mouth 2 (two) times daily.    [provider]  carvedilol (COREG) 12.5 MG tablet Take 12.5 mg by mouth 2 (two) times daily. 05/31/23   [provider]  Cholecalciferol (VITAMIN D-3) 25 MCG (1000 UT) CAPS Take by mouth daily.    [provider]  denosumab  (PROLIA ) 60 MG/ML SOSY injection Inject 60 mg into the skin every 6 (six) months. May 1st    [provider]  dicyclomine  (BENTYL ) 10 MG capsule Take 10 mg by mouth once as needed for spasms.    [provider]  losartan (COZAAR) 25 MG tablet Take 12.5 mg by mouth daily.    [provider]  meclizine  (ANTIVERT ) 25 MG tablet Take 1 tablet (25 mg total) by mouth 3 (three) times daily as needed for dizziness. 07/24/16    Little, Vernell Search, MD  Multiple Vitamin (MULTIVITAMIN WITH MINERALS) TABS Take 1 tablet by mouth daily. One a Day Women's 50 plus    [provider]  pantoprazole (PROTONIX) 40 MG tablet Take 40 mg by mouth every other day. 10/20/15   [provider]  vitamin B-12 (CYANOCOBALAMIN ) 500 MCG tablet Take 500 mcg by mouth daily.    [provider]    Allergies: Fosamax [alendronate sodium], Boniva [ibandronate], Evista [raloxifene], Levbid [hyoscyamine], Nexium [esomeprazole magnesium], Norco [hydrocodone -acetaminophen ], Prevacid [lansoprazole], and Morphine and codeine    Review of Systems  Eyes:  Negative for visual disturbance.  Respiratory:  Negative for shortness of breath.   Cardiovascular:  Positive for chest pain.  Neurological:  Positive for light-headedness and headaches. Negative for weakness.    Updated Vital Signs BP (!) 182/83 (BP Location: Right Arm)   Pulse 70   Temp 98.5 F (36.9 C) (Oral)   Resp 16   Ht 4' 10 (1.473 m)   Wt 55.3 kg   SpO2 100%   BMI 25.50 kg/m   Physical Exam Vitals and nursing note reviewed.  Constitutional:      General: She is not in acute distress.    Appearance: She is not ill-appearing.  HENT:     Head: Normocephalic.  Eyes:     Pupils: Pupils are equal, round, and reactive to light.  Cardiovascular:     Rate and Rhythm: Normal  rate and regular rhythm.     Pulses: Normal pulses.     Heart sounds: Normal heart sounds. No murmur heard.    No friction rub. No gallop.  Pulmonary:     Effort: Pulmonary effort is normal.     Breath sounds: Normal breath sounds.  Abdominal:     General: Abdomen is flat. There is no distension.     Palpations: Abdomen is soft.     Tenderness: There is no abdominal tenderness. There is no guarding or rebound.  Musculoskeletal:        General: Normal range of motion.     Cervical back: Neck supple.  Skin:    General: Skin is warm and dry.  Neurological:     General: No focal  deficit present.     Mental Status: She is alert.     Comments: Speech is clear, able to follow commands CN III-XII intact Normal strength in upper and lower extremities bilaterally including dorsiflexion and plantar flexion, strong and equal grip strength Sensation grossly intact throughout Moves extremities without ataxia, coordination intact No pronator drift   Psychiatric:        Mood and Affect: Mood normal.        Behavior: Behavior normal.     (all labs ordered are listed, but only abnormal results are displayed) Labs Reviewed  COMPREHENSIVE METABOLIC PANEL WITH GFR - Abnormal; Notable for the following components:      Result Value   Glucose, Bld 110 (*)    All other components within normal limits  CBC  TROPONIN T, HIGH SENSITIVITY  TROPONIN T, HIGH SENSITIVITY    EKG: EKG Interpretation Date/Time:  Sunday May 12 2024 16:51:47 EST Ventricular Rate:  69 PR Interval:  147 QRS Duration:  88 QT Interval:  416 QTC Calculation: 446 R Axis:   9  Text Interpretation: Sinus rhythm Posterior infarct, old Borderline ST depression, anterolateral leads Confirmed by Dasie Faden ([id number]) on 05/12/2024 6:11:42 PM  Radiology: DG Chest Portable 1 View Result Date: 05/12/2024 CLINICAL DATA:  Chest pain EXAM: PORTABLE CHEST 1 VIEW COMPARISON:  Chest x-ray 05/13/2022 FINDINGS: Aorta is ectatic with atherosclerotic calcifications, similar to prior. The heart is mildly enlarged. The lungs are clear. There is no pleural effusion or pneumothorax. No acute fractures are seen. IMPRESSION: 1. No active disease. 2. Mild cardiomegaly. Electronically Signed   By: Greig Pique M.D.   On: 05/12/2024 18:40   CT ANGIO HEAD NECK W WO CM Result Date: 05/12/2024 EXAM: CT HEAD WITHOUT CTA HEAD AND NECK WITH AND WITHOUT 05/12/2024 05:58:03 PM TECHNIQUE: CTA of the head and neck was performed with and without the administration of intravenous contrast. Noncontrast CT of the head with  reconstructed 2-D images are also provided for review. Multiplanar 2D and/or 3D reformatted images are provided for review. Automated exposure control, iterative reconstruction, and/or weight based adjustment of the mA/kV was utilized to reduce the radiation dose to as low as reasonably achievable. COMPARISON: CT Head dated 11/10/2021 CLINICAL HISTORY: Headache, sudden, severe. FINDINGS: CT HEAD: BRAIN AND VENTRICLES: No acute intracranial hemorrhage. No mass effect or midline shift. No extra-axial fluid collection. Gray-white differentiation is maintained. No hydrocephalus. Moderate atrophy and advanced diffuse white matter disease is similar to the prior study. No hyperdense vessel is present. ORBITS: Bilateral lens replacements are noted. The globes and orbits are otherwise within normal limits. SINUSES: No acute abnormality. SOFT TISSUES AND SKULL: No acute abnormality. CTA NECK: AORTIC ARCH AND ARCH VESSELS: No dissection or  arterial injury. Atherosclerotic calcifications are present at the aortic arch and at the origin of the left subclavian artery without significant stenosis. Focal calcification is present at the origin of the right subclavian artery without significant stenosis. CERVICAL CAROTID ARTERIES: No dissection, arterial injury, or hemodynamically significant stenosis by NASCET criteria. Calcification is present at the right carotid bifurcation without significant stenosis. CERVICAL VERTEBRAL ARTERIES: No dissection or arterial injury. The left vertebral artery is dominant. A 50% stenosis is present at the origin of the left vertebral artery. VISUALIZED LUNGS AND MEDIASTINUM: Unremarkable. SOFT TISSUES: No acute abnormality. BONES: No acute abnormality. CTA HEAD: ANTERIOR CIRCULATION: Atherosclerotic calcifications are present within the cavernous internal carotid arteries bilaterally without significant stenoses. The right A1 segment is dominant. No significant stenosis of the anterior cerebral  arteries. No significant stenosis of the middle cerebral arteries. No aneurysm. POSTERIOR CIRCULATION: The right vertebral artery essentially terminates at the PICA with a hypoplastic distal V4 segment with a hypoplastic versus high grade stenosis in the distal right V4 segment. . The basilar artery is small, essentially terminating at the superior cerebellar arteries. The posterior cerebral arteries are fetal type bilaterally. No significant stenosis of the posterior cerebral arteries. No significant stenosis of the basilar artery. The intracranial right V4 segment is hypoplastic with a hypoplastic versus high grade stenosis. . No aneurysm. OTHER: No dural venous sinus thrombosis on this non-dedicated study. IMPRESSION: 1. No acute intracranial hemorrhage. 2. Moderate cerebral atrophy and advanced diffuse white matter disease, unchanged. 3. Approximately 50% stenosis at the origin of the left vertebral artery. 4. Atherosclerotic calcifications in the cavernous internal carotid arteries without significant stenosis. 5. Atherosclerotic calcifications at the aortic arch and the origins of the left and right subclavian arteries without significant stenosis. Electronically signed by: Lonni Necessary MD 05/12/2024 06:30 PM EST RP Workstation: [id number]     Procedures   Medications Ordered in the ED  iohexol  (OMNIPAQUE ) 350 MG/ML injection 75 mL (75 mLs Intravenous Contrast Given 05/12/24 1748)    Clinical Course as of 05/12/24 1908  Sun May 12, 2024  1722 BP rechecked during initial evaluation. 198/90.  [CA]  1742 BP improved to 182/83 [CA]  1802 BP improved to 162/127 [CA]    Clinical Course User Index [CA] Lorelle Aleck BROCKS, PA-C                                 Medical Decision Making Amount and/or Complexity of Data Reviewed Independent Historian: caregiver Labs: ordered. Decision-making details documented in ED Course. Radiology: ordered and independent interpretation performed.  Decision-making details documented in ED Course. ECG/medicine tests: ordered and independent interpretation performed. Decision-making details documented in ED Course.  Risk Prescription drug management.   This patient presents to the ED for concern of elevated BP + headache, this involves an extensive number of treatment options, and is a complaint that carries with it a high risk of complications and morbidity.  The differential diagnosis includes intracranial bleed, CVA, hypertensive emergency, etc  89 year old female presents to the ED due to elevated blood pressure associated with headache and dizziness.  Currently on carvedilol and losartan which she has been compliant with.  Took medication while here in the ED. Had some chest pain earlier today which she believes was reflux. Upon arrival, BP elevated at 215/98. BP rechecked during initial evaluation which improved to 198/90.  Patient well-appearing on exam.  Son at bedside.  No neurological deficits.  CTA  head/neck ordered.  Routine labs.  Will recheck blood pressure since patient just took her at home medications. May need IV antihypertensives. Cardiac labs ordered. Low suspicion for PE/DVT. Low suspicion for aortic dissection.  CBC unremarkable.  CMP reassuring.  Normal renal function.  No major electrolyte derangements.  Troponin normal.  EKG normal sinus rhythm.  Some ST wave abnormalities. 2nd troponin ordered.  CTA head and neck personally reviewed and interpreted negative for any acute abnormalities.  Chest x-ray negative for signs of pneumonia, pneumothorax, widened mediastinum.  Upon reassessment, BP improved to the 160s.   Patient handed off to [facility], PA-C at shift change pending troponin and repeat assessment.   Co morbidities that complicate the patient evaluation  HTN Cardiac Monitoring: / EKG:  The patient was maintained on a cardiac monitor.  I personally viewed and interpreted the cardiac monitored which showed  an underlying rhythm of: NSR  Social Determinants of Health:  Elderly >65  Test / Admission - Considered:  Disposition pending at shift change   Discussed with Dr. Dasie who evaluated patient at bedside and agrees with assessment and plan.     Final diagnoses:  Elevated blood pressure reading  Acute nonintractable headache, unspecified headache type  Lightheadedness  Nonspecific chest pain    ED Discharge Orders     None          Lorelle Aleck JAYSON DEVONNA 05/12/24 1909    Dasie Faden, MD 05/14/24 [phone]

## 2024-05-15 DIAGNOSIS — Z0000 Encounter for general adult medical examination without abnormal findings: Secondary | ICD-10-CM | POA: Diagnosis not present

## 2024-05-15 DIAGNOSIS — E7800 Pure hypercholesterolemia, unspecified: Secondary | ICD-10-CM | POA: Diagnosis not present

## 2024-05-15 DIAGNOSIS — I10 Essential (primary) hypertension: Secondary | ICD-10-CM | POA: Diagnosis not present

## 2024-05-15 DIAGNOSIS — R7303 Prediabetes: Secondary | ICD-10-CM | POA: Diagnosis not present

## 2024-05-15 DIAGNOSIS — M810 Age-related osteoporosis without current pathological fracture: Secondary | ICD-10-CM | POA: Diagnosis not present

## 2024-05-29 DIAGNOSIS — I10 Essential (primary) hypertension: Secondary | ICD-10-CM | POA: Diagnosis not present

## 2024-06-04 ENCOUNTER — Other Ambulatory Visit: Payer: Self-pay

## 2024-06-04 ENCOUNTER — Inpatient Hospital Stay (HOSPITAL_COMMUNITY)
Admission: EM | Admit: 2024-06-04 | Discharge: 2024-06-12 | DRG: 185 | Disposition: A | Discharge status: Skilled Nursing Facility | Attending: Internal Medicine | Admitting: Internal Medicine

## 2024-06-04 ENCOUNTER — Emergency Department (HOSPITAL_COMMUNITY)

## 2024-06-04 ENCOUNTER — Encounter (HOSPITAL_COMMUNITY): Payer: Self-pay | Admitting: Emergency Medicine

## 2024-06-04 DIAGNOSIS — K219 Gastro-esophageal reflux disease without esophagitis: Secondary | ICD-10-CM | POA: Diagnosis present

## 2024-06-04 DIAGNOSIS — W19XXXA Unspecified fall, initial encounter: Secondary | ICD-10-CM

## 2024-06-04 DIAGNOSIS — W010XXA Fall on same level from slipping, tripping and stumbling without subsequent striking against object, initial encounter: Principal | ICD-10-CM | POA: Diagnosis present

## 2024-06-04 DIAGNOSIS — S2241XA Multiple fractures of ribs, right side, initial encounter for closed fracture: Principal | ICD-10-CM | POA: Diagnosis present

## 2024-06-04 DIAGNOSIS — Y92009 Unspecified place in unspecified non-institutional (private) residence as the place of occurrence of the external cause: Secondary | ICD-10-CM

## 2024-06-04 DIAGNOSIS — M810 Age-related osteoporosis without current pathological fracture: Secondary | ICD-10-CM | POA: Diagnosis present

## 2024-06-04 DIAGNOSIS — Z90710 Acquired absence of both cervix and uterus: Secondary | ICD-10-CM

## 2024-06-04 DIAGNOSIS — Z885 Allergy status to narcotic agent status: Secondary | ICD-10-CM

## 2024-06-04 DIAGNOSIS — Z87442 Personal history of urinary calculi: Secondary | ICD-10-CM

## 2024-06-04 DIAGNOSIS — Z85828 Personal history of other malignant neoplasm of skin: Secondary | ICD-10-CM

## 2024-06-04 DIAGNOSIS — Z888 Allergy status to other drugs, medicaments and biological substances status: Secondary | ICD-10-CM

## 2024-06-04 DIAGNOSIS — S2231XA Fracture of one rib, right side, initial encounter for closed fracture: Secondary | ICD-10-CM

## 2024-06-04 DIAGNOSIS — S2249XA Multiple fractures of ribs, unspecified side, initial encounter for closed fracture: Secondary | ICD-10-CM | POA: Diagnosis present

## 2024-06-04 DIAGNOSIS — E7800 Pure hypercholesterolemia, unspecified: Secondary | ICD-10-CM | POA: Diagnosis present

## 2024-06-04 DIAGNOSIS — M1990 Unspecified osteoarthritis, unspecified site: Secondary | ICD-10-CM | POA: Diagnosis present

## 2024-06-04 DIAGNOSIS — K589 Irritable bowel syndrome without diarrhea: Secondary | ICD-10-CM | POA: Diagnosis present

## 2024-06-04 DIAGNOSIS — Z9049 Acquired absence of other specified parts of digestive tract: Secondary | ICD-10-CM

## 2024-06-04 DIAGNOSIS — S20221A Contusion of right back wall of thorax, initial encounter: Secondary | ICD-10-CM | POA: Diagnosis present

## 2024-06-04 DIAGNOSIS — Z79899 Other long term (current) drug therapy: Secondary | ICD-10-CM

## 2024-06-04 DIAGNOSIS — R42 Dizziness and giddiness: Secondary | ICD-10-CM

## 2024-06-04 DIAGNOSIS — K30 Functional dyspepsia: Secondary | ICD-10-CM | POA: Diagnosis present

## 2024-06-04 DIAGNOSIS — Z8249 Family history of ischemic heart disease and other diseases of the circulatory system: Secondary | ICD-10-CM

## 2024-06-04 DIAGNOSIS — I10 Essential (primary) hypertension: Secondary | ICD-10-CM | POA: Diagnosis present

## 2024-06-04 DIAGNOSIS — M549 Dorsalgia, unspecified: Secondary | ICD-10-CM | POA: Diagnosis present

## 2024-06-04 DIAGNOSIS — G8929 Other chronic pain: Secondary | ICD-10-CM | POA: Diagnosis present

## 2024-06-04 LAB — BASIC METABOLIC PANEL WITH GFR
Anion gap: 14 (ref 5–15)
BUN: 26 mg/dL — ABNORMAL HIGH (ref 8–23)
CO2: 23 mmol/L (ref 22–32)
Calcium: 9.7 mg/dL (ref 8.9–10.3)
Chloride: 102 mmol/L (ref 98–111)
Creatinine, Ser: 0.82 mg/dL (ref 0.44–1.00)
GFR, Estimated: >60 mL/min
Glucose, Bld: 126 mg/dL — ABNORMAL HIGH (ref 70–99)
Potassium: 4.1 mmol/L (ref 3.5–5.1)
Sodium: 139 mmol/L (ref 135–145)

## 2024-06-04 LAB — CBC
HCT: 41.3 % (ref 36.0–46.0)
Hemoglobin: 13.5 g/dL (ref 12.0–15.0)
MCH: 29.7 pg (ref 26.0–34.0)
MCHC: 32.7 g/dL (ref 30.0–36.0)
MCV: 91 fL (ref 80.0–100.0)
Platelets: 242 K/uL (ref 150–400)
RBC: 4.54 MIL/uL (ref 3.87–5.11)
RDW: 12.9 % (ref 11.5–15.5)
WBC: 9.6 K/uL (ref 4.0–10.5)
nRBC: 0 % (ref 0.0–0.2)

## 2024-06-04 MED ORDER — ACETAMINOPHEN 325 MG PO TABS
650.0000 mg | ORAL_TABLET | Freq: Four times a day (QID) | ORAL | Status: AC | PRN
  Administered 2024-06-04: 650 mg via ORAL
  Filled 2024-06-04: qty 2

## 2024-06-04 MED ORDER — MORPHINE SULFATE (PF) 2 MG/ML IV SOLN
2.0000 mg | Freq: Once | INTRAVENOUS | Status: AC
  Administered 2024-06-05: 2 mg via INTRAVENOUS
  Filled 2024-06-04: qty 1

## 2024-06-04 MED ORDER — ONDANSETRON HCL 4 MG/2ML IJ SOLN
4.0000 mg | Freq: Once | INTRAMUSCULAR | Status: DC
  Filled 2024-06-04: qty 2

## 2024-06-04 NOTE — ED Provider Notes (Signed)
 Care assumed at 2300.  Patient here for evaluation of rib fractures following a fall.  Care assumed pending CT chest.  CT demonstrates 6 right-sided rib fractures, no additional complicating features.  Medicine consulted for admission given high risk nature of injury.  Patient updated findings of studies and she is in agreement with admission for ongoing care.   Griselda Norris, MD 06/05/24 [phone]

## 2024-06-04 NOTE — ED Triage Notes (Signed)
 Pt fell today around 12 today. Was seen at [facility] and was dx w/ 6 broken ribs. Was told to come to ED to make sure she didn't have pneumonia.

## 2024-06-04 NOTE — ED Provider Notes (Signed)
 " [place] [facility]MENT AT [facility] Provider Note   CSN: [id number] Arrival date & time: 06/04/24  1845     Patient presents with: [place]   Yolanda Phillips is a 89 y.o. female.   Pt with fall today, went to [facility], was told had six rib fractures and sent to ED. Indicates mechanical fall, with foot/shoe getting caught on flooring and causing self to fall. No faintness or dizziness prior to fall, felt normal, at baseline, prior to fall. Denies loc. No headache. No midline neck or back pain. C/o pain to upper and mid thoracic back, right sided. No radicular pain. Also c/o pain to sacral area. Has been ambulatory post fall. No anticoag use. No numbness/weakness. No chest pain anteriorly. No sob. No abd pain or nv. Denies other pain or injury.   The history is provided by the patient, medical records and a relative.  Fall Pertinent negatives include no chest pain, no abdominal pain, no headaches and no shortness of breath.       Prior to Admission medications  Medication Sig Start Date End Date Taking? Authorizing Provider  acetaminophen  (TYLENOL ) 500 MG tablet Take 1,000 mg by mouth every 4 (four) hours as needed for mild pain.    [provider]  calcium -vitamin D  (OSCAL WITH D) 500-200 MG-UNIT per tablet Take 1 tablet by mouth 2 (two) times daily.    [provider]  carvedilol  (COREG ) 12.5 MG tablet Take 12.5 mg by mouth 2 (two) times daily. 05/31/23   [provider]  Cholecalciferol  (VITAMIN D -3) 25 MCG (1000 UT) CAPS Take by mouth daily.    [provider]  denosumab  (PROLIA ) 60 MG/ML SOSY injection Inject 60 mg into the skin every 6 (six) months. May 1st    [provider]  dicyclomine  (BENTYL ) 10 MG capsule Take 10 mg by mouth once as needed for spasms.    [provider]  losartan  (COZAAR ) 25 MG tablet Take 12.5 mg by mouth daily.    [provider]  meclizine  (ANTIVERT ) 25 MG tablet Take 1  tablet (25 mg total) by mouth 3 (three) times daily as needed for dizziness. 07/24/16   Little, Vernell Search, MD  Multiple Vitamin (MULTIVITAMIN WITH MINERALS) TABS Take 1 tablet by mouth daily. One a Day Women's 50 plus    [provider]  pantoprazole  (PROTONIX ) 40 MG tablet Take 40 mg by mouth every other day. 10/20/15   [provider]  vitamin B-12 (CYANOCOBALAMIN ) 500 MCG tablet Take 500 mcg by mouth daily.    [provider]    Allergies: Fosamax [alendronate sodium], Boniva [ibandronate], Evista [raloxifene], Levbid [hyoscyamine], Nexium [esomeprazole magnesium], Norco [hydrocodone -acetaminophen ], Prevacid [lansoprazole], and Morphine  and codeine    Review of Systems  Constitutional:  Negative for chills and fever.  HENT:  Negative for nosebleeds.   Respiratory:  Negative for shortness of breath.   Cardiovascular:  Negative for chest pain.  Gastrointestinal:  Negative for abdominal pain, nausea and vomiting.  Genitourinary:  Negative for flank pain.  Musculoskeletal:  Positive for back pain. Negative for neck pain.  Skin:  Negative for rash.  Neurological:  Negative for weakness, numbness and headaches.    Updated Vital Signs BP (!) 162/70   Pulse 77   Temp (!) 96.8 F (36 C) (Oral)   Resp 18   SpO2 96%   Physical Exam Vitals and nursing note reviewed.  Constitutional:      Appearance: Normal appearance. She is  well-developed.  HENT:     Head: Atraumatic.     Nose: Nose normal.     Mouth/Throat:     Mouth: Mucous membranes are moist.  Eyes:     General: No scleral icterus.    Conjunctiva/sclera: Conjunctivae normal.     Pupils: Pupils are equal, round, and reactive to light.  Neck:     Trachea: No tracheal deviation.  Cardiovascular:     Rate and Rhythm: Normal rate and regular rhythm.     Pulses: Normal pulses.     Heart sounds: Normal heart sounds. No murmur heard.    No friction rub. No gallop.  Pulmonary:     Effort: Pulmonary  effort is normal. No respiratory distress.     Breath sounds: Normal breath sounds.  Chest:     Chest wall: No tenderness.  Abdominal:     General: Bowel sounds are normal. There is no distension.     Palpations: Abdomen is soft.     Tenderness: There is no abdominal tenderness.  Musculoskeletal:        General: No swelling.     Cervical back: Normal range of motion and neck supple. No rigidity or tenderness. No muscular tenderness.     Comments: Lower lumbar and sacral area tenderness, otherwise, CTLS spine, non tender, aligned, no step off. Right scapula and right thoracic tenderness.  Good rom bil extremities without pain or focal bony tenderness.   Skin:    General: Skin is warm and dry.     Findings: No rash.  Neurological:     Mental Status: She is alert.     Comments: Alert, speech normal. GCS 15. Motor/sens grossly intact bil.   Psychiatric:        Mood and Affect: Mood normal.     (all labs ordered are listed, but only abnormal results are displayed) Results for orders placed or performed during the hospital encounter of 06/04/24  CBC   Collection Time: 06/04/24 11:02 PM  Result Value Ref Range   WBC 9.6 4.0 - 10.5 K/uL   RBC 4.54 3.87 - 5.11 MIL/uL   Hemoglobin 13.5 12.0 - 15.0 g/dL   HCT 58.6 63.9 - 53.9 %   MCV 91.0 80.0 - 100.0 fL   MCH 29.7 26.0 - 34.0 pg   MCHC 32.7 30.0 - 36.0 g/dL   RDW 87.0 88.4 - 84.4 %   Platelets 242 150 - 400 K/uL   nRBC 0.0 0.0 - 0.2 %   DG Lumbar Spine Complete Result Date: 06/04/2024 EXAM: 4 OR MORE VIEW(S) XRAY OF THE LUMBAR SPINE 06/04/2024 10:21:00 PM COMPARISON: Comparison with 06/30/2022 CT. CLINICAL HISTORY: fall, pain FINDINGS: LUMBAR SPINE: BONES: Vertebral body heights are maintained. Left convex lumbar curve. Demineralization. DISCS AND DEGENERATIVE CHANGES: Moderate lower lumbar facet arthropathy. SOFT TISSUES: No acute abnormality. IMPRESSION: 1. No evidence of acute traumatic injury. Electronically signed by: Norman Gatlin MD 06/04/2024 10:30 PM EST RP Workstation: [id number]   DG Pelvis 1-2 Views Result Date: 06/04/2024 EXAM: 1 OR 2 VIEW(S) XRAY OF THE PELVIS 06/04/2024 10:21:00 PM COMPARISON: None available. CLINICAL HISTORY: fall, pain FINDINGS: BONES AND JOINTS: No acute fracture. No malalignment. Degenerative changes of the lower lumbar spine. Degenerative changes of the hips, left greater than right. SOFT TISSUES: Surgical clip overlies the right hemipelvis. IMPRESSION: 1. No acute fracture. Electronically signed by: Norman Gatlin MD 06/04/2024 10:29 PM EST RP Workstation: [id number]   DG Chest 2 View Result Date: 06/04/2024 CLINICAL DATA:  Right-sided rib  fractures EXAM: DG CHEST 2V COMPARISON:  06/04/2024, 05/12/2024, chest CT 01/13/2023 FINDINGS: Aortic atherosclerosis. Mild cardiomegaly. Multiple acute appearing right sided rib fractures. No convincing pneumothorax. Slight hazy asymmetrical appearance of the right thorax compared to left, but no definitive pleural effusion on lateral view. Multiple chronic compression deformities of the thoracic spine with post augmentation change. IMPRESSION: 1. Multiple acute displaced right rib fractures without definitive pneumothorax by radiography. 2. Slight asymmetrical hazy opacification of right thorax compared to left, without definite pleural fluid correlate on lateral view, could be secondary to soft tissue artifact, however given multiple right rib fractures, suggest correlation with chest CT. Electronically Signed   By: Luke Bun M.D.   On: 06/04/2024 20:30   DG Chest Portable 1 View Result Date: 05/12/2024 CLINICAL DATA:  Chest pain EXAM: PORTABLE CHEST 1 VIEW COMPARISON:  Chest x-ray 05/13/2022 FINDINGS: Aorta is ectatic with atherosclerotic calcifications, similar to prior. The heart is mildly enlarged. The lungs are clear. There is no pleural effusion or pneumothorax. No acute fractures are seen. IMPRESSION: 1. No active disease. 2. Mild  cardiomegaly. Electronically Signed   By: Greig Pique M.D.   On: 05/12/2024 18:40   CT ANGIO HEAD NECK W WO CM Result Date: 05/12/2024 EXAM: CT HEAD WITHOUT CTA HEAD AND NECK WITH AND WITHOUT 05/12/2024 05:58:03 PM TECHNIQUE: CTA of the head and neck was performed with and without the administration of intravenous contrast. Noncontrast CT of the head with reconstructed 2-D images are also provided for review. Multiplanar 2D and/or 3D reformatted images are provided for review. Automated exposure control, iterative reconstruction, and/or weight based adjustment of the mA/kV was utilized to reduce the radiation dose to as low as reasonably achievable. COMPARISON: CT Head dated 11/10/2021 CLINICAL HISTORY: Headache, sudden, severe. FINDINGS: CT HEAD: BRAIN AND VENTRICLES: No acute intracranial hemorrhage. No mass effect or midline shift. No extra-axial fluid collection. Gray-white differentiation is maintained. No hydrocephalus. Moderate atrophy and advanced diffuse white matter disease is similar to the prior study. No hyperdense vessel is present. ORBITS: Bilateral lens replacements are noted. The globes and orbits are otherwise within normal limits. SINUSES: No acute abnormality. SOFT TISSUES AND SKULL: No acute abnormality. CTA NECK: AORTIC ARCH AND ARCH VESSELS: No dissection or arterial injury. Atherosclerotic calcifications are present at the aortic arch and at the origin of the left subclavian artery without significant stenosis. Focal calcification is present at the origin of the right subclavian artery without significant stenosis. CERVICAL CAROTID ARTERIES: No dissection, arterial injury, or hemodynamically significant stenosis by NASCET criteria. Calcification is present at the right carotid bifurcation without significant stenosis. CERVICAL VERTEBRAL ARTERIES: No dissection or arterial injury. The left vertebral artery is dominant. A 50% stenosis is present at the origin of the left vertebral artery.  VISUALIZED LUNGS AND MEDIASTINUM: Unremarkable. SOFT TISSUES: No acute abnormality. BONES: No acute abnormality. CTA HEAD: ANTERIOR CIRCULATION: Atherosclerotic calcifications are present within the cavernous internal carotid arteries bilaterally without significant stenoses. The right A1 segment is dominant. No significant stenosis of the anterior cerebral arteries. No significant stenosis of the middle cerebral arteries. No aneurysm. POSTERIOR CIRCULATION: The right vertebral artery essentially terminates at the PICA with a hypoplastic distal V4 segment with a hypoplastic versus high grade stenosis in the distal right V4 segment. . The basilar artery is small, essentially terminating at the superior cerebellar arteries. The posterior cerebral arteries are fetal type bilaterally. No significant stenosis of the posterior cerebral arteries. No significant stenosis of the basilar artery. The intracranial right V4  segment is hypoplastic with a hypoplastic versus high grade stenosis. . No aneurysm. OTHER: No dural venous sinus thrombosis on this non-dedicated study. IMPRESSION: 1. No acute intracranial hemorrhage. 2. Moderate cerebral atrophy and advanced diffuse white matter disease, unchanged. 3. Approximately 50% stenosis at the origin of the left vertebral artery. 4. Atherosclerotic calcifications in the cavernous internal carotid arteries without significant stenosis. 5. Atherosclerotic calcifications at the aortic arch and the origins of the left and right subclavian arteries without significant stenosis. Electronically signed by: Lonni Necessary MD 05/12/2024 06:30 PM EST RP Workstation: [id number]     EKG: None  Radiology: DG Lumbar Spine Complete Result Date: 06/04/2024 EXAM: 4 OR MORE VIEW(S) XRAY OF THE LUMBAR SPINE 06/04/2024 10:21:00 PM COMPARISON: Comparison with 06/30/2022 CT. CLINICAL HISTORY: fall, pain FINDINGS: LUMBAR SPINE: BONES: Vertebral body heights are maintained. Left convex  lumbar curve. Demineralization. DISCS AND DEGENERATIVE CHANGES: Moderate lower lumbar facet arthropathy. SOFT TISSUES: No acute abnormality. IMPRESSION: 1. No evidence of acute traumatic injury. Electronically signed by: Norman Gatlin MD 06/04/2024 10:30 PM EST RP Workstation: [id number]   DG Pelvis 1-2 Views Result Date: 06/04/2024 EXAM: 1 OR 2 VIEW(S) XRAY OF THE PELVIS 06/04/2024 10:21:00 PM COMPARISON: None available. CLINICAL HISTORY: fall, pain FINDINGS: BONES AND JOINTS: No acute fracture. No malalignment. Degenerative changes of the lower lumbar spine. Degenerative changes of the hips, left greater than right. SOFT TISSUES: Surgical clip overlies the right hemipelvis. IMPRESSION: 1. No acute fracture. Electronically signed by: Norman Gatlin MD 06/04/2024 10:29 PM EST RP Workstation: [id number]   DG Chest 2 View Result Date: 06/04/2024 CLINICAL DATA:  Right-sided rib fractures EXAM: DG CHEST 2V COMPARISON:  06/04/2024, 05/12/2024, chest CT 01/13/2023 FINDINGS: Aortic atherosclerosis. Mild cardiomegaly. Multiple acute appearing right sided rib fractures. No convincing pneumothorax. Slight hazy asymmetrical appearance of the right thorax compared to left, but no definitive pleural effusion on lateral view. Multiple chronic compression deformities of the thoracic spine with post augmentation change. IMPRESSION: 1. Multiple acute displaced right rib fractures without definitive pneumothorax by radiography. 2. Slight asymmetrical hazy opacification of right thorax compared to left, without definite pleural fluid correlate on lateral view, could be secondary to soft tissue artifact, however given multiple right rib fractures, suggest correlation with chest CT. Electronically Signed   By: Luke Bun M.D.   On: 06/04/2024 20:30     Procedures   Medications Ordered in the ED  morphine  (PF) 2 MG/ML injection 2 mg (2 mg Intravenous Patient Refused/Not Given 06/04/24 2301)  ondansetron  (ZOFRAN )  injection 4 mg (4 mg Intravenous Patient Refused/Not Given 06/04/24 2302)  acetaminophen  (TYLENOL ) tablet 650 mg (650 mg Oral Given 06/04/24 1921)                                    Medical Decision Making Problems Addressed: Closed fracture of multiple ribs of right side, initial encounter: acute illness or injury with systemic symptoms that poses a threat to life or bodily functions Contusion of right back wall of thorax, initial encounter: acute illness or injury with systemic symptoms that poses a threat to life or bodily functions Fall from slip, trip, or stumble, initial encounter: acute illness or injury with systemic symptoms that poses a threat to life or bodily functions  Amount and/or Complexity of Data Reviewed Independent Historian:     Details: Family. hx External Data Reviewed: notes. Labs: ordered. Decision-making details documented in ED Course. Radiology: ordered and  independent interpretation performed. Decision-making details documented in ED Course.  Risk OTC drugs. Prescription drug management. Parenteral controlled substances. Decision regarding hospitalization.   Iv ns.  Labs ordered/sent. Imaging ordered.   Differential diagnosis includes rib fractures, ptx, etc. Dispo decision including potential need for admission considered - will get labs and imaging and reassess.   Reviewed nursing notes and prior charts for additional history. External reports reviewed. Additional history from: family.   Morphine  iv. Zofran  iv.   Labs reviewed/interpreted by me - wbc and hgb normal. Other labs pending.   Xrays reviewed/interpreted by me - right rib fxs.   CT reviewed/interpreted by me - pnd.   2313, labs and ct pending - signed out to Dr Griselda to check pending labs and ct, and anticipate probable admission to surgery/trauma.         Final diagnoses:  Fall from slip, trip, or stumble, initial encounter  Contusion of right back wall of thorax, initial  encounter  Closed fracture of multiple ribs of right side, initial encounter    ED Discharge Orders     None          Bernard Drivers, MD 06/04/24 2314  "

## 2024-06-05 ENCOUNTER — Encounter (HOSPITAL_COMMUNITY): Payer: Self-pay

## 2024-06-05 ENCOUNTER — Emergency Department (HOSPITAL_COMMUNITY)

## 2024-06-05 DIAGNOSIS — S2241XA Multiple fractures of ribs, right side, initial encounter for closed fracture: Secondary | ICD-10-CM | POA: Diagnosis present

## 2024-06-05 DIAGNOSIS — Z885 Allergy status to narcotic agent status: Secondary | ICD-10-CM | POA: Diagnosis not present

## 2024-06-05 DIAGNOSIS — Z87442 Personal history of urinary calculi: Secondary | ICD-10-CM | POA: Diagnosis not present

## 2024-06-05 DIAGNOSIS — Z85828 Personal history of other malignant neoplasm of skin: Secondary | ICD-10-CM | POA: Diagnosis not present

## 2024-06-05 DIAGNOSIS — Z79899 Other long term (current) drug therapy: Secondary | ICD-10-CM | POA: Diagnosis not present

## 2024-06-05 DIAGNOSIS — K219 Gastro-esophageal reflux disease without esophagitis: Secondary | ICD-10-CM

## 2024-06-05 DIAGNOSIS — M1990 Unspecified osteoarthritis, unspecified site: Secondary | ICD-10-CM | POA: Diagnosis present

## 2024-06-05 DIAGNOSIS — M549 Dorsalgia, unspecified: Secondary | ICD-10-CM | POA: Diagnosis present

## 2024-06-05 DIAGNOSIS — Y92009 Unspecified place in unspecified non-institutional (private) residence as the place of occurrence of the external cause: Secondary | ICD-10-CM | POA: Diagnosis not present

## 2024-06-05 DIAGNOSIS — Z888 Allergy status to other drugs, medicaments and biological substances status: Secondary | ICD-10-CM | POA: Diagnosis not present

## 2024-06-05 DIAGNOSIS — M810 Age-related osteoporosis without current pathological fracture: Secondary | ICD-10-CM | POA: Diagnosis present

## 2024-06-05 DIAGNOSIS — S2249XA Multiple fractures of ribs, unspecified side, initial encounter for closed fracture: Secondary | ICD-10-CM | POA: Diagnosis present

## 2024-06-05 DIAGNOSIS — I10 Essential (primary) hypertension: Secondary | ICD-10-CM

## 2024-06-05 DIAGNOSIS — K30 Functional dyspepsia: Secondary | ICD-10-CM | POA: Diagnosis present

## 2024-06-05 DIAGNOSIS — G8929 Other chronic pain: Secondary | ICD-10-CM | POA: Diagnosis present

## 2024-06-05 DIAGNOSIS — R42 Dizziness and giddiness: Secondary | ICD-10-CM

## 2024-06-05 DIAGNOSIS — Z9049 Acquired absence of other specified parts of digestive tract: Secondary | ICD-10-CM | POA: Diagnosis not present

## 2024-06-05 DIAGNOSIS — S2231XA Fracture of one rib, right side, initial encounter for closed fracture: Secondary | ICD-10-CM

## 2024-06-05 DIAGNOSIS — K589 Irritable bowel syndrome without diarrhea: Secondary | ICD-10-CM | POA: Diagnosis present

## 2024-06-05 DIAGNOSIS — W010XXA Fall on same level from slipping, tripping and stumbling without subsequent striking against object, initial encounter: Secondary | ICD-10-CM | POA: Diagnosis present

## 2024-06-05 DIAGNOSIS — W19XXXA Unspecified fall, initial encounter: Secondary | ICD-10-CM | POA: Diagnosis not present

## 2024-06-05 DIAGNOSIS — S20221A Contusion of right back wall of thorax, initial encounter: Secondary | ICD-10-CM | POA: Diagnosis present

## 2024-06-05 DIAGNOSIS — E7800 Pure hypercholesterolemia, unspecified: Secondary | ICD-10-CM | POA: Diagnosis present

## 2024-06-05 DIAGNOSIS — Z90710 Acquired absence of both cervix and uterus: Secondary | ICD-10-CM | POA: Diagnosis not present

## 2024-06-05 DIAGNOSIS — Z8249 Family history of ischemic heart disease and other diseases of the circulatory system: Secondary | ICD-10-CM | POA: Diagnosis not present

## 2024-06-05 LAB — CBC
HCT: 36.4 % (ref 36.0–46.0)
Hemoglobin: 11.9 g/dL — ABNORMAL LOW (ref 12.0–15.0)
MCH: 29.5 pg (ref 26.0–34.0)
MCHC: 32.7 g/dL (ref 30.0–36.0)
MCV: 90.1 fL (ref 80.0–100.0)
Platelets: 216 K/uL (ref 150–400)
RBC: 4.04 MIL/uL (ref 3.87–5.11)
RDW: 12.8 % (ref 11.5–15.5)
WBC: 8.6 K/uL (ref 4.0–10.5)
nRBC: 0 % (ref 0.0–0.2)

## 2024-06-05 LAB — COMPREHENSIVE METABOLIC PANEL WITH GFR
ALT: 16 U/L (ref 0–44)
AST: 28 U/L (ref 15–41)
Albumin: 3.6 g/dL (ref 3.5–5.0)
Alkaline Phosphatase: 41 U/L (ref 38–126)
Anion gap: 13 (ref 5–15)
BUN: 22 mg/dL (ref 8–23)
CO2: 23 mmol/L (ref 22–32)
Calcium: 9.1 mg/dL (ref 8.9–10.3)
Chloride: 103 mmol/L (ref 98–111)
Creatinine, Ser: 0.82 mg/dL (ref 0.44–1.00)
GFR, Estimated: >60 mL/min
Glucose, Bld: 119 mg/dL — ABNORMAL HIGH (ref 70–99)
Potassium: 3.8 mmol/L (ref 3.5–5.1)
Sodium: 138 mmol/L (ref 135–145)
Total Bilirubin: 0.8 mg/dL (ref 0.0–1.2)
Total Protein: 6 g/dL — ABNORMAL LOW (ref 6.5–8.1)

## 2024-06-05 LAB — VITAMIN D 25 HYDROXY (VIT D DEFICIENCY, FRACTURES): Vit D, 25-Hydroxy: 76.9 ng/mL (ref 30–100)

## 2024-06-05 MED ORDER — ACETAMINOPHEN 325 MG PO TABS
650.0000 mg | ORAL_TABLET | Freq: Four times a day (QID) | ORAL | Status: DC | PRN
  Administered 2024-06-05: 650 mg via ORAL
  Filled 2024-06-05: qty 2

## 2024-06-05 MED ORDER — MECLIZINE HCL 25 MG PO TABS: 25.0000 mg | ORAL_TABLET | Freq: Three times a day (TID) | ORAL | Status: DC | PRN

## 2024-06-05 MED ORDER — KETOROLAC TROMETHAMINE 15 MG/ML IJ SOLN
7.5000 mg | Freq: Four times a day (QID) | INTRAMUSCULAR | Status: AC
  Administered 2024-06-05 (×4): 7.5 mg via INTRAVENOUS
  Filled 2024-06-05 (×4): qty 1

## 2024-06-05 MED ORDER — OYSTER SHELL CALCIUM/D3 500-5 MG-MCG PO TABS
1.0000 | ORAL_TABLET | Freq: Two times a day (BID) | ORAL | Status: DC
  Administered 2024-06-05 – 2024-06-11 (×13): 1 via ORAL
  Filled 2024-06-05 (×11): qty 1

## 2024-06-05 MED ORDER — IOHEXOL 300 MG/ML  SOLN
75.0000 mL | Freq: Once | INTRAMUSCULAR | Status: AC | PRN
  Administered 2024-06-05: 75 mL via INTRAVENOUS

## 2024-06-05 MED ORDER — OXYCODONE HCL 5 MG PO TABS
5.0000 mg | ORAL_TABLET | Freq: Four times a day (QID) | ORAL | Status: DC | PRN
  Administered 2024-06-06: 5 mg via ORAL
  Filled 2024-06-05: qty 1

## 2024-06-05 MED ORDER — HEPARIN SODIUM (PORCINE) 5000 UNIT/ML IJ SOLN
5000.0000 [IU] | Freq: Three times a day (TID) | INTRAMUSCULAR | Status: DC
  Administered 2024-06-05 – 2024-06-12 (×22): 5000 [IU] via SUBCUTANEOUS
  Filled 2024-06-05 (×15): qty 1

## 2024-06-05 MED ORDER — SODIUM CHLORIDE 0.9% FLUSH
3.0000 mL | Freq: Two times a day (BID) | INTRAVENOUS | Status: DC
  Administered 2024-06-05 – 2024-06-12 (×16): 3 mL via INTRAVENOUS

## 2024-06-05 MED ORDER — SODIUM CHLORIDE 0.9% FLUSH: 3.0000 mL | INTRAVENOUS | Status: DC | PRN

## 2024-06-05 MED ORDER — POLYETHYLENE GLYCOL 3350 17 G PO PACK
17.0000 g | PACK | Freq: Two times a day (BID) | ORAL | Status: DC | PRN
  Administered 2024-06-06 – 2024-06-08 (×2): 17 g via ORAL
  Filled 2024-06-05 (×2): qty 1

## 2024-06-05 MED ORDER — SODIUM CHLORIDE 0.9 % IV SOLN: 250.0000 mL | INTRAVENOUS | Status: AC | PRN

## 2024-06-05 MED ORDER — ADULT MULTIVITAMIN W/MINERALS CH
1.0000 | ORAL_TABLET | Freq: Every day | ORAL | Status: DC
  Administered 2024-06-06 – 2024-06-11 (×6): 1 via ORAL
  Filled 2024-06-05 (×5): qty 1

## 2024-06-05 MED ORDER — LOSARTAN POTASSIUM 25 MG PO TABS
12.5000 mg | ORAL_TABLET | Freq: Every day | ORAL | Status: DC
  Administered 2024-06-05 – 2024-06-12 (×8): 12.5 mg via ORAL
  Filled 2024-06-05 (×5): qty 0.5

## 2024-06-05 MED ORDER — SENNOSIDES-DOCUSATE SODIUM 8.6-50 MG PO TABS
1.0000 | ORAL_TABLET | Freq: Two times a day (BID) | ORAL | Status: DC | PRN
  Administered 2024-06-06 – 2024-06-07 (×2): 1 via ORAL
  Filled 2024-06-05 (×2): qty 1

## 2024-06-05 MED ORDER — CARVEDILOL 12.5 MG PO TABS
12.5000 mg | ORAL_TABLET | Freq: Two times a day (BID) | ORAL | Status: DC
  Administered 2024-06-05 – 2024-06-12 (×15): 12.5 mg via ORAL
  Filled 2024-06-05 (×10): qty 1

## 2024-06-05 MED ORDER — CALCIUM CARBONATE-VITAMIN D 500-200 MG-UNIT PO TABS: 1.0000 | ORAL_TABLET | Freq: Two times a day (BID) | ORAL | Status: DC

## 2024-06-05 MED ORDER — PANTOPRAZOLE SODIUM 40 MG PO TBEC
40.0000 mg | DELAYED_RELEASE_TABLET | ORAL | Status: DC
  Administered 2024-06-05 – 2024-06-11 (×4): 40 mg via ORAL
  Filled 2024-06-05 (×3): qty 1

## 2024-06-05 MED ORDER — TRAMADOL HCL 50 MG PO TABS
25.0000 mg | ORAL_TABLET | Freq: Once | ORAL | Status: AC
  Administered 2024-06-05: 25 mg via ORAL
  Filled 2024-06-05: qty 1

## 2024-06-05 MED ORDER — ONDANSETRON HCL 4 MG PO TABS: 4.0000 mg | ORAL_TABLET | Freq: Four times a day (QID) | ORAL | Status: DC | PRN

## 2024-06-05 MED ORDER — HYDROMORPHONE HCL 1 MG/ML IJ SOLN
0.5000 mg | INTRAMUSCULAR | Status: DC | PRN
  Administered 2024-06-05 – 2024-06-06 (×2): 0.5 mg via INTRAVENOUS
  Filled 2024-06-05 (×2): qty 0.5

## 2024-06-05 MED ORDER — SODIUM CHLORIDE 0.9% FLUSH
3.0000 mL | Freq: Two times a day (BID) | INTRAVENOUS | Status: DC
  Administered 2024-06-05 – 2024-06-12 (×14): 3 mL via INTRAVENOUS

## 2024-06-05 MED ORDER — METHOCARBAMOL 1000 MG/10ML IJ SOLN
500.0000 mg | Freq: Four times a day (QID) | INTRAMUSCULAR | Status: DC | PRN
  Administered 2024-06-11 – 2024-06-12 (×2): 500 mg via INTRAVENOUS
  Filled 2024-06-05: qty 10

## 2024-06-05 MED ORDER — ACETAMINOPHEN 325 MG PO TABS
650.0000 mg | ORAL_TABLET | Freq: Four times a day (QID) | ORAL | Status: DC
  Administered 2024-06-05 – 2024-06-12 (×26): 650 mg via ORAL
  Filled 2024-06-05 (×19): qty 2

## 2024-06-05 MED ORDER — FENTANYL CITRATE (PF) 50 MCG/ML IJ SOSY: 12.5000 ug | PREFILLED_SYRINGE | INTRAMUSCULAR | Status: DC | PRN

## 2024-06-05 MED ORDER — ONDANSETRON HCL 4 MG/2ML IJ SOLN
4.0000 mg | Freq: Four times a day (QID) | INTRAMUSCULAR | Status: DC | PRN
  Administered 2024-06-05 – 2024-06-06 (×2): 4 mg via INTRAVENOUS
  Filled 2024-06-05 (×2): qty 2

## 2024-06-05 MED ORDER — LIDOCAINE 5 % EX PTCH
1.0000 | MEDICATED_PATCH | CUTANEOUS | Status: DC
  Administered 2024-06-05 – 2024-06-12 (×8): 1 via TRANSDERMAL
  Filled 2024-06-05 (×5): qty 1

## 2024-06-05 NOTE — Evaluation (Addendum)
 Physical Therapy Evaluation Patient Details Name: Yolanda Phillips MRN: [id number] DOB: [birth year] Today's Date: 06/05/2024  History of Present Illness  89 yo female admitted with R side posterior rib fxs #5-10 after sustaining a fall at home. Hx of essential HTN, nephrolithiasis, chronic dizziness, chronic back pain, osteoporosis  Clinical Impression  On eval, pt required Min assist for mobility. She ambulated ~65 feet x 2 with 1 HHA. Pt reports 5/10 pain with mobility on today. Pt presents with general weakness, decreased activity tolerance, and impaired gait/balance. BP 163/75, HR 63 bpm, O2 90% on RA. Discussed d/c plan-pt plans to return home where she lives alone. Recommended to pt that she consider arranging for some in-home assistance. Suggested she can consider sleeping in her chair with an ottoman until she can get in/out of bed easier. Will plan to follow pt during this hospital stay. Will recommend HHPT f/u however pt did not seem to feel she will need f/u PT.         If plan is discharge home, recommend the following: A little help with walking and/or transfers;A little help with bathing/dressing/bathroom;Assistance with cooking/housework;Assist for transportation;Help with stairs or ramp for entrance   Can travel by private vehicle        Equipment Recommendations None recommended by PT  Recommendations for Other Services       Functional Status Assessment Patient has had a recent decline in their functional status and demonstrates the ability to make significant improvements in function in a reasonable and predictable amount of time.     Precautions / Restrictions Precautions Precautions: Fall Restrictions Weight Bearing Restrictions Per Provider Order: No      Mobility  Bed Mobility Overal bed mobility: Needs Assistance Bed Mobility: Supine to Sit, Sit to Sidelying     Supine to sit: Modified independent (Device/Increase time), HOB elevated, Used rails   Sit to  sidelying: Mod assist General bed mobility comments: Assist for bil LEs onto bed. Increased time.    Transfers Overall transfer level: Needs assistance Equipment used: 1 person hand held assist Transfers: Sit to/from Stand Sit to Stand: Min assist           General transfer comment: steadying assist    Ambulation/Gait Ambulation/Gait assistance: Min assist Gait Distance (Feet): 65 Feet (x2) Assistive device: 1 person hand held assist Gait Pattern/deviations: Step-through pattern, Decreased stride length       General Gait Details: Light steadying assistance throughout distance with pt also occasionally using hallway handrail. Slow gait speed. Tolerated distance well.  Stairs            Wheelchair Mobility     Tilt Bed    Modified Rankin (Stroke Patients Only)       Balance Overall balance assessment: Needs assistance         Standing balance support: During functional activity, Single extremity supported Standing balance-Leahy Scale: Fair                               Pertinent Vitals/Pain Pain Assessment Pain Assessment: 0-10 Pain Score: 5  Pain Location: R ribs Pain Descriptors / Indicators: Discomfort, Sore, Guarding Pain Intervention(s): Limited activity within patient's tolerance, Monitored during session, Repositioned    Home Living Family/patient expects to be discharged to:: Private residence Living Arrangements: Alone Available Help at Discharge: Family;Available PRN/intermittently Type of Home: House Home Access: Stairs to enter Entrance Stairs-Rails: Left Entrance Stairs-Number of Steps: 2  Home Layout: One level Home Equipment: Grab bars - tub/shower;Shower Counsellor (2 wheels);Cane - single point;BSC/3in1;Wheelchair - manual      Prior Function Prior Level of Function : Independent/Modified Independent;Driving             Mobility Comments: walks without AD ADLs Comments: Independent in ADLs and  IADLs     Extremity/Trunk Assessment   Upper Extremity Assessment Upper Extremity Assessment: Defer to OT evaluation    Lower Extremity Assessment Lower Extremity Assessment: Generalized weakness    Cervical / Trunk Assessment Cervical / Trunk Assessment: Kyphotic (R rib fxs)  Communication   Communication Communication: Impaired Factors Affecting Communication: Hearing impaired    Cognition Arousal: Alert Behavior During Therapy: WFL for tasks assessed/performed                             Following commands: Intact       Cueing Cueing Techniques: Verbal cues     General Comments      Exercises     Assessment/Plan    PT Assessment Patient needs continued PT services  PT Problem List Decreased activity tolerance;Decreased balance;Decreased mobility;Pain;Decreased knowledge of use of DME       PT Treatment Interventions DME instruction;Gait training;Functional mobility training;Therapeutic activities;Therapeutic exercise;Patient/family education;Balance training    PT Goals (Current goals can be found in the Care Plan section)  Acute Rehab PT Goals Patient Stated Goal: less pain. return to PLOF PT Goal Formulation: With patient Time For Goal Achievement: 06/19/24 Potential to Achieve Goals: Good    Frequency Min 3X/week     Co-evaluation               AM-PAC PT 6 Clicks Mobility  Outcome Measure Help needed turning from your back to your side while in a flat bed without using bedrails?: A Little Help needed moving from lying on your back to sitting on the side of a flat bed without using bedrails?: A Little Help needed moving to and from a bed to a chair (including a wheelchair)?: A Little Help needed standing up from a chair using your arms (e.g., wheelchair or bedside chair)?: A Little Help needed to walk in hospital room?: A Little Help needed climbing 3-5 steps with a railing? : A Little 6 Click Score: 18    End of Session    Activity Tolerance: Patient limited by pain;Patient tolerated treatment well Patient left: in bed;with call bell/phone within reach;with bed alarm set   PT Visit Diagnosis: Unsteadiness on feet (R26.81);History of falling (Z91.81);Difficulty in walking, not elsewhere classified (R26.2)    Time: 8572-8550 PT Time Calculation (min) (ACUTE ONLY): 22 min   Charges:   PT Evaluation $PT Eval Low Complexity: 1 Low   PT General Charges $$ ACUTE PT VISIT: 1 Visit          Dannial SQUIBB, PT Acute Rehabilitation  Office: [phone]

## 2024-06-05 NOTE — Progress Notes (Signed)
 " PROGRESS NOTE  Yolanda Phillips FMW:[id number] DOB: 22-Jan-1931   PCP: Leonel Cole, MD  Patient is from: Home.  DOA: 06/04/2024 LOS: 0  Chief complaints Chief Complaint  Patient presents with   Fall     Brief Narrative / Interim history: 89 year old F with PMH of HTN, chronic dizziness, osteoporosis, osteoarthritis, diverticulosis, GERD, IBS, HLD and nephrolithiasis sent to ED from urgent care after she was found to have 6 rib fractures.  Reportedly had accidental fall after hip/shoe code on the floor at home.  No prodromes leading to fall.  Denies hitting head or LOC.  Chest x-ray and CT chest showed fractures of posterior right 5th-10th ribs with minimal displacement.  Pelvic and lumbar x-ray without acute finding.  Patient was admitted for pain control and therapy evaluation.  Subjective: Seen and examined earlier this morning.  No major events overnight or this morning.  Sleepy but wakes to voice.  She is not alert but oriented x 4.  States that her pain is fairly controlled.  Denies shortness of breath.   Assessment and plan: Accidental fall at home-reportedly fell after his shoe/foot was was caught on the floor Acute right-sided rib fracture 5th to 10th due to fall.  No flail chest or pneumothorax. -On room air.  Continue pulmonary toilet -Multimodal pain control with scheduled Tylenol  and Toradol  and as needed oxycodone  and Dilaudid . -Bowel regimen for constipation. -Fall precaution, PT/OT eval -Check vitamin D  level.  Essential hypertension: Markedly elevated BP likely due to pain.  Improved. -Pain control as above. -Continue home Coreg  and losartan    GERD -Continue Protonix    Chronic vertigo/dizziness: Denies. - Hold meclizine .  Body mass index is 24.24 kg/m.          DVT prophylaxis:  heparin  injection 5,000 Units Start: 06/05/24 0600 SCDs Start: 06/05/24 0157 Place TED hose Start: 06/05/24 0157  Code Status: Full code Family Communication: None at  bedside Level of care: Telemetry Status is: Inpatient Remains inpatient appropriate because: Fall at home, traumatic rib fracture   Final disposition: To be determined   35 minutes with more than 50% spent in reviewing records, counseling patient/family and coordinating care.  Consultants:  None  Procedures: None  Microbiology summarized: None  Objective: Vitals:   06/05/24 0322 06/05/24 0610 06/05/24 0912 06/05/24 1313  BP:  114/65 (!) 107/50 130/72  Pulse:  63 65 (!) 57  Resp:  18  18  Temp:  (!) 97.5 F (36.4 C)  97.6 F (36.4 C)  TempSrc:  Oral  Oral  SpO2:  94%  93%  Weight: 52.6 kg     Height: 4' 10 (1.473 m)       Examination:  GENERAL: No apparent distress.  Nontoxic. HEENT: MMM.  Vision and hearing grossly intact.  NECK: Supple.  No apparent JVD.  RESP:  No IWOB.  Fair aeration bilaterally. CVS:  RRR. Heart sounds normal.  ABD/GI/GU: BS+. Abd soft, NTND.  MSK/EXT:  Moves extremities. No apparent deformity. No edema.  SKIN: no apparent skin lesion or wound NEURO: Sleepy but wakes to voice.  Oriented x 4.  No apparent focal neuro deficit. PSYCH: Calm. Normal affect.   Sch Meds:  Scheduled Meds:  acetaminophen   650 mg Oral Q6H WA   calcium -vitamin D   1 tablet Oral BID   carvedilol   12.5 mg Oral BID   heparin   5,000 Units Subcutaneous Q8H   ketorolac   7.5 mg Intravenous Q6H   lidocaine   1 patch Transdermal Q24H   losartan   12.5 mg Oral Daily   multivitamin with minerals  1 tablet Oral Daily   ondansetron  (ZOFRAN ) IV  4 mg Intravenous Once   pantoprazole   40 mg Oral QODAY   sodium chloride  flush  3 mL Intravenous Q12H   sodium chloride  flush  3 mL Intravenous Q12H   Continuous Infusions:  sodium chloride      PRN Meds:.sodium chloride , HYDROmorphone  (DILAUDID ) injection, methocarbamol  (ROBAXIN ) injection, ondansetron  **OR** ondansetron  (ZOFRAN ) IV, oxyCODONE , sodium chloride  flush  Antimicrobials: Anti-infectives (From admission, onward)     None        I have personally reviewed the following labs and images: CBC: Recent Labs  Lab 06/04/24 2302 06/05/24 0554  WBC 9.6 8.6  HGB 13.5 11.9*  HCT 41.3 36.4  MCV 91.0 90.1  PLT 242 216   BMP &GFR Recent Labs  Lab 06/04/24 2302 06/05/24 0554  NA 139 138  K 4.1 3.8  CL 102 103  CO2 23 23  GLUCOSE 126* 119*  BUN 26* 22  CREATININE 0.82 0.82  CALCIUM  9.7 9.1   Estimated Creatinine Clearance: 32.2 mL/min (by C-G formula based on SCr of 0.82 mg/dL). Liver & Pancreas: Recent Labs  Lab 06/05/24 0554  AST 28  ALT 16  ALKPHOS 41  BILITOT 0.8  PROT 6.0*  ALBUMIN 3.6   No results for input(s): LIPASE, AMYLASE in the last 168 hours. No results for input(s): AMMONIA in the last 168 hours. Diabetic: No results for input(s): HGBA1C in the last 72 hours. No results for input(s): GLUCAP in the last 168 hours. Cardiac Enzymes: No results for input(s): CKTOTAL, CKMB, CKMBINDEX, TROPONINI in the last 168 hours. No results for input(s): PROBNP in the last 8760 hours. Coagulation Profile: No results for input(s): INR, PROTIME in the last 168 hours. Thyroid  Function Tests: No results for input(s): TSH, T4TOTAL, FREET4, T3FREE, THYROIDAB in the last 72 hours. Lipid Profile: No results for input(s): CHOL, HDL, LDLCALC, TRIG, CHOLHDL, LDLDIRECT in the last 72 hours. Anemia Panel: No results for input(s): VITAMINB12, FOLATE, FERRITIN, TIBC, IRON, RETICCTPCT in the last 72 hours. Urine analysis:    Component Value Date/Time   COLORURINE YELLOW 06/05/2022 1159   APPEARANCEUR CLEAR 06/05/2022 1159   LABSPEC 1.042 (H) 06/05/2022 1159   PHURINE 6.0 06/05/2022 1159   GLUCOSEU NEGATIVE 06/05/2022 1159   HGBUR SMALL (A) 06/05/2022 1159   BILIRUBINUR NEGATIVE 06/05/2022 1159   KETONESUR 5 (A) 06/05/2022 1159   PROTEINUR NEGATIVE 06/05/2022 1159   UROBILINOGEN 0.2 03/12/2011 1541   NITRITE NEGATIVE 06/05/2022 1159    LEUKOCYTESUR LARGE (A) 06/05/2022 1159   Sepsis Labs: Invalid input(s): PROCALCITONIN, LACTICIDVEN  Microbiology: No results found for this or any previous visit (from the past 240 hours).  Radiology Studies: CT Chest W Contrast Result Date: 06/05/2024 EXAM: CT CHEST WITH CONTRAST 06/05/2024 12:55:21 AM TECHNIQUE: CT of the chest was performed with the administration of 75 mL of iohexol  (OMNIPAQUE ) 300 MG/ML solution. Multiplanar reformatted images are provided for review. Automated exposure control, iterative reconstruction, and/or weight based adjustment of the mA/kV was utilized to reduce the radiation dose to as low as reasonably achievable. COMPARISON: Same day x-ray. CLINICAL HISTORY: Chest trauma, blunt; assess rib fractures, r/o ptx, r/o scapula fracture. FINDINGS: MEDIASTINUM: Aortic and coronary artery atherosclerotic calcifications. Heart and pericardium are otherwise unremarkable. The central airways are clear. LYMPH NODES: No mediastinal, hilar or axillary lymphadenopathy. LUNGS AND PLEURA: Chronic scarring in the lower lungs. No focal consolidation or pulmonary edema. No pleural effusion or pneumothorax. SOFT  TISSUES/BONES: Mildly displaced fracture of the posterior lateral right 5th rib. Nondisplaced fracture of the posterior right 6th rib near the costovertebral junction. Mildly displaced fracture of the posterior right 7th rib near the costovertebral junction. Additional nondisplaced fracture of the posterior right 7th rib. Nondisplaced fracture of the posterior right 8th rib near the costovertebral junction and nondisplaced additional posterior right 8th rib fracture. Nondisplaced fractures of the posterior right 9th and 10th ribs near the costovertebral junction. No scapular fracture. Thoracic kyphosis. Chronic compression fractures of T8 and T9. Chronic fracture of T9. Vertebroplasty of T9. No acute abnormality of the soft tissues. UPPER ABDOMEN: Limited images of the upper abdomen  demonstrates no acute abnormality. IMPRESSION: 1. Fractures of the posterior right 5th - 10th ribs as described. No scapular fracture or pneumothorax. Electronically signed by: Norman Gatlin MD 06/05/2024 01:08 AM EST RP Workstation: [id number]   DG Lumbar Spine Complete Result Date: 06/04/2024 EXAM: 4 OR MORE VIEW(S) XRAY OF THE LUMBAR SPINE 06/04/2024 10:21:00 PM COMPARISON: Comparison with 06/30/2022 CT. CLINICAL HISTORY: fall, pain FINDINGS: LUMBAR SPINE: BONES: Vertebral body heights are maintained. Left convex lumbar curve. Demineralization. DISCS AND DEGENERATIVE CHANGES: Moderate lower lumbar facet arthropathy. SOFT TISSUES: No acute abnormality. IMPRESSION: 1. No evidence of acute traumatic injury. Electronically signed by: Norman Gatlin MD 06/04/2024 10:30 PM EST RP Workstation: [id number]   DG Pelvis 1-2 Views Result Date: 06/04/2024 EXAM: 1 OR 2 VIEW(S) XRAY OF THE PELVIS 06/04/2024 10:21:00 PM COMPARISON: None available. CLINICAL HISTORY: fall, pain FINDINGS: BONES AND JOINTS: No acute fracture. No malalignment. Degenerative changes of the lower lumbar spine. Degenerative changes of the hips, left greater than right. SOFT TISSUES: Surgical clip overlies the right hemipelvis. IMPRESSION: 1. No acute fracture. Electronically signed by: Norman Gatlin MD 06/04/2024 10:29 PM EST RP Workstation: [id number]   DG Chest 2 View Result Date: 06/04/2024 CLINICAL DATA:  Right-sided rib fractures EXAM: DG CHEST 2V COMPARISON:  06/04/2024, 05/12/2024, chest CT 01/13/2023 FINDINGS: Aortic atherosclerosis. Mild cardiomegaly. Multiple acute appearing right sided rib fractures. No convincing pneumothorax. Slight hazy asymmetrical appearance of the right thorax compared to left, but no definitive pleural effusion on lateral view. Multiple chronic compression deformities of the thoracic spine with post augmentation change. IMPRESSION: 1. Multiple acute displaced right rib fractures without definitive  pneumothorax by radiography. 2. Slight asymmetrical hazy opacification of right thorax compared to left, without definite pleural fluid correlate on lateral view, could be secondary to soft tissue artifact, however given multiple right rib fractures, suggest correlation with chest CT. Electronically Signed   By: Luke Bun M.D.   On: 06/04/2024 20:30      Karolyna Bianchini T. Cotina Freedman [facility]  If 7PM-7AM, please contact night-coverage [email or web address] 06/05/2024, 3:37 PM   "

## 2024-06-05 NOTE — Evaluation (Signed)
 Occupational Therapy Evaluation Patient Details Name: Yolanda Phillips MRN: [id number] DOB: 12/14/1930 Today's Date: 06/05/2024   History of Present Illness   89 yo female admitted with R side posterior rib fxs #5-10 after sustaining a fall at home. Hx of essential HTN, nephrolithiasis, chronic dizziness, chronic back pain, osteoporosis     Clinical Impressions Pt was living alone independently and driving prior to admission. Presents with pain and unsteady gait requiring CGA with furniture walking vs hand held assist in hall. Pt needs up to CGA for ADLs. Pt does not want to ask for her children to stay with her or for her to return home with one of them. Recommended hiring a caregiver and HHOT if pt is agreeable. Will follow acutely.      If plan is discharge home, recommend the following:   A little help with walking and/or transfers;A little help with bathing/dressing/bathroom;Assistance with cooking/housework;Assist for transportation;Help with stairs or ramp for entrance     Functional Status Assessment   Patient has had a recent decline in their functional status and demonstrates the ability to make significant improvements in function in a reasonable and predictable amount of time.     Equipment Recommendations   None recommended by OT     Recommendations for Other Services         Precautions/Restrictions   Precautions Precautions: Fall Restrictions Weight Bearing Restrictions Per Provider Order: No     Mobility Bed Mobility Overal bed mobility: Needs Assistance Bed Mobility: Supine to Sit, Sit to Sidelying     Supine to sit: Modified independent (Device/Increase time), HOB elevated, Used rails   Sit to sidelying: Mod assist General bed mobility comments: Assist for bil LEs onto bed. Increased time.    Transfers Overall transfer level: Needs assistance Equipment used: 1 person hand held assist Transfers: Sit to/from Stand Sit to Stand: Min  assist           General transfer comment: steadying assist      Balance Overall balance assessment: Needs assistance   Sitting balance-Leahy Scale: Good     Standing balance support: During functional activity, Single extremity supported Standing balance-Leahy Scale: Fair                             ADL either performed or assessed with clinical judgement   ADL Overall ADL's : Needs assistance/impaired Eating/Feeding: Independent;Bed level   Grooming: Supervision/safety;Standing   Upper Body Bathing: Minimal assistance;Sitting   Lower Body Bathing: Supervison/ safety;Sit to/from stand   Upper Body Dressing : Minimal assistance;Sitting   Lower Body Dressing: Supervision/safety;Sit to/from stand   Toilet Transfer: Contact guard assist   Toileting- Clothing Manipulation and Hygiene: Supervision/safety;Sit to/from stand       Functional mobility during ADLs: Minimal assistance;Contact guard assist       Vision Baseline Vision/History: 1 Wears glasses Ability to See in Adequate Light: 0 Adequate Patient Visual Report: No change from baseline Additional Comments: reading glasses     Perception         Praxis         Pertinent Vitals/Pain Pain Assessment Pain Assessment: 0-10 Pain Score: 5  Pain Location: R ribs Pain Descriptors / Indicators: Discomfort, Sore, Guarding Pain Intervention(s): Monitored during session, Repositioned, Premedicated before session     Extremity/Trunk Assessment Upper Extremity Assessment Upper Extremity Assessment: Overall WFL for tasks assessed;Right hand dominant   Lower Extremity Assessment Lower Extremity Assessment: Defer to PT  evaluation   Cervical / Trunk Assessment Cervical / Trunk Assessment: Kyphotic;Other exceptions (R rib fractures)   Communication Communication Communication: Impaired Factors Affecting Communication: Hearing impaired   Cognition Arousal: Alert Behavior During Therapy: WFL  for tasks assessed/performed Cognition: No apparent impairments                               Following commands: Intact       Cueing  General Comments   Cueing Techniques: Verbal cues   SpO2 90% after ambulation, encouraged use of IS   Exercises     Shoulder Instructions      Home Living Family/patient expects to be discharged to:: Private residence Living Arrangements: Alone Available Help at Discharge: Family;Available PRN/intermittently Type of Home: House Home Access: Stairs to enter [employer] of Steps: 2 Entrance Stairs-Rails: Left Home Layout: One level     Bathroom Shower/Tub: Producer, Television/film/video: Handicapped height     Home Equipment: Grab bars - tub/shower;Shower Counsellor (2 wheels);Cane - single point;BSC/3in1;Wheelchair - manual          Prior Functioning/Environment Prior Level of Function : Independent/Modified Independent;Driving             Mobility Comments: walks without AD ADLs Comments: Independent in ADLs and IADLs    OT Problem List: Impaired balance (sitting and/or standing);Pain   OT Treatment/Interventions: Self-care/ADL training;DME and/or AE instruction;Patient/family education;Balance training      OT Goals(Current goals can be found in the care plan section)   Acute Rehab OT Goals OT Goal Formulation: With patient Time For Goal Achievement: 06/19/24 Potential to Achieve Goals: Good ADL Goals Pt Will Perform Grooming: with modified independence;standing Pt Will Perform Lower Body Bathing: with modified independence;sit to/from stand Pt Will Perform Lower Body Dressing: with modified independence;sit to/from stand Pt Will Transfer to Toilet: with modified independence;ambulating;regular height toilet Pt Will Perform Toileting - Clothing Manipulation and hygiene: with modified independence;sit to/from stand Additional ADL Goal #1: Pt will complete bed mobility mod I in  preparation for ADLs.   OT Frequency:  Min 2X/week    Co-evaluation              AM-PAC OT 6 Clicks Daily Activity     Outcome Measure Help from another person eating meals?: None Help from another person taking care of personal grooming?: A Little Help from another person toileting, which includes using toliet, bedpan, or urinal?: A Little Help from another person bathing (including washing, rinsing, drying)?: A Little Help from another person to put on and taking off regular upper body clothing?: A Little Help from another person to put on and taking off regular lower body clothing?: A Little 6 Click Score: 19   End of Session Equipment Utilized During Treatment: Gait belt Nurse Communication: Mobility status  Activity Tolerance: Patient tolerated treatment well Patient left: in bed;with call bell/phone within reach;with bed alarm set  OT Visit Diagnosis: Unsteadiness on feet (R26.81);Pain                Time: 8572-8550 OT Time Calculation (min): 22 min Charges:  OT General Charges $OT Visit: 1 Visit OT Evaluation $OT Eval Low Complexity: 1 Low  Mliss HERO, OTR/L Acute Rehabilitation Services Office: [phone]   Kennth Mliss Helling 06/05/2024, 4:14 PM

## 2024-06-05 NOTE — H&P (Signed)
 " History and Physical    Yolanda Phillips FMW:[id number] DOB: 23-May-1931 DOA: 06/04/2024  PCP: Leonel Cole, MD   Patient coming from: Home   Chief Complaint:  Chief Complaint  Patient presents with   Fall   ED TRIAGE note:Pt fell today around 12 today. Was seen at [facility] and was dx w/ 6 broken ribs. Was told to come to ED to make sure she didn't have pneumonia.   HPI:  Yolanda Phillips is a 89 y.o. female with medical history significant of essential hypertension, osteoporosis, chronic dizziness osteoarthritis, diverticulosis, GERD, IBS, nephrolithiasis and hyperlipidemia presented to emergency department referred from urgent care as found to have 6 rib fractures and sent to ED for evaluation. Indicates mechanical fall, with foot/shoe getting caught on flooring and causing self to fall. No faintness or dizziness prior to fall, felt normal, at baseline, prior to fall. Denies loc. No headache. No midline neck or back pain. C/o pain to upper and mid thoracic back, right sided. No radicular pain. Also c/o pain to sacral area. Has been ambulatory post fall. No anticoag use. No numbness/weakness. No chest pain anteriorly. No sob. No abd pain or nv. Denies other pain or injury.    ED Course:  At presentation to ED patient is hemodynamically stable.  O2 sat 96 to 97% room air. Lab work, CBC unremarkable.  BMP unremarkable.   Imaging, chest x-ray:  Multiple acute displaced right rib fractures without definitive pneumothorax by radiography. 2. Slight asymmetrical hazy opacification of right thorax compared to left, without definite pleural fluid correlate on lateral view, could be secondary to soft tissue artifact, however given multiple right rib fractures, suggest correlation with chest CT.  CT chest: Fractures of the posterior right 5th - 10th ribs as described. No scapular fracture or pneumothorax.  X-ray lumbar spine no acute traumatic injury.  X-ray pelvis no traumatic injury.  In the ED  patient received Zofran  and morphine .  Due to significant pain patient is not agreeable to discharge to home.   Hospitalist consulted for management of right-sided rib fractures.  Significant labs in the ED: Lab Orders         Basic metabolic panel with GFR         CBC         CBC         Comprehensive metabolic panel       Review of Systems:  Review of Systems  Constitutional:  Negative for malaise/fatigue and weight loss.  Respiratory:  Negative for cough, sputum production and shortness of breath.   Cardiovascular:  Negative for chest pain and leg swelling.  Musculoskeletal:  Positive for falls.       Right-sided rib cage pain  Neurological:  Negative for dizziness and headaches.  Psychiatric/Behavioral:  The patient is not nervous/anxious.     Past Medical History:  Diagnosis Date   Arthritis    Cancer (HCC)    removed skin cancer from her arm   Chronic back pain    Costochondritis    Diverticulosis    Diverticulosis    GERD (gastroesophageal reflux disease)    Hypercholesterolemia    IBS (irritable bowel syndrome)    Kidney stone    Osteoporosis    PONV (postoperative nausea and vomiting)     Past Surgical History:  Procedure Laterality Date   ABDOMINAL HYSTERECTOMY     BREAST SURGERY Bilateral    implants   CHOLECYSTECTOMY     COLONOSCOPY  EYE SURGERY     bilateral cataracts   FACIAL COSMETIC SURGERY     20 + years ago   KYPHOPLASTY N/A 06/18/2015   Procedure: KYPHOPLASTY;  Surgeon: Oneil Priestly, MD;  Location: [facility] OR;  Service: Orthopedics;  Laterality: N/A;  Thoracic 9 kyphoplasty   MELANOMA EXCISION Right 01/14/2013   Procedure: WIDE LOCAL EXCISION RIGHT WRIST MELANOMA ;  Surgeon: Redell Faith, DO;  Location: [facility] OR;  Service: General;  Laterality: Right;   TONSILLECTOMY       reports that she has never smoked. She has never used smokeless tobacco. She reports that she does not drink alcohol and does not use drugs.  Allergies[1]  Family History   Problem Relation Age of Onset   Multiple myeloma Mother    Hypertension Father     Prior to Admission medications  Medication Sig Start Date End Date Taking? Authorizing Provider  acetaminophen  (TYLENOL ) 500 MG tablet Take 1,000 mg by mouth every 4 (four) hours as needed for mild pain.    [provider]  calcium -vitamin D  (OSCAL WITH D) 500-200 MG-UNIT per tablet Take 1 tablet by mouth 2 (two) times daily.    [provider]  carvedilol  (COREG ) 12.5 MG tablet Take 12.5 mg by mouth 2 (two) times daily. 05/31/23   [provider]  Cholecalciferol  (VITAMIN D -3) 25 MCG (1000 UT) CAPS Take by mouth daily.    [provider]  denosumab  (PROLIA ) 60 MG/ML SOSY injection Inject 60 mg into the skin every 6 (six) months. May 1st    [provider]  dicyclomine  (BENTYL ) 10 MG capsule Take 10 mg by mouth once as needed for spasms.    [provider]  losartan  (COZAAR ) 25 MG tablet Take 12.5 mg by mouth daily.    [provider]  meclizine  (ANTIVERT ) 25 MG tablet Take 1 tablet (25 mg total) by mouth 3 (three) times daily as needed for dizziness. 07/24/16   Little, Vernell Search, MD  Multiple Vitamin (MULTIVITAMIN WITH MINERALS) TABS Take 1 tablet by mouth daily. One a Day Women's 50 plus    [provider]  pantoprazole  (PROTONIX ) 40 MG tablet Take 40 mg by mouth every other day. 10/20/15   [provider]  vitamin B-12 (CYANOCOBALAMIN ) 500 MCG tablet Take 500 mcg by mouth daily.    [provider]     Physical Exam: Vitals:   06/04/24 2225 06/05/24 0144 06/05/24 0252 06/05/24 0300  BP: (!) 162/70 (!) 171/74 (!) 210/79 (!) 182/91  Pulse: 77 78 78   Resp: 18 17 17    Temp: (!) 96.8 F (36 C) 97.7 F (36.5 C) 97.8 F (36.6 C)   TempSrc: Oral Oral Oral   SpO2: 96% 95% 92%     Physical Exam Vitals and nursing note reviewed.  Constitutional:      General: She is not in acute distress.    Appearance: She is  not ill-appearing.  HENT:     Mouth/Throat:     Mouth: Mucous membranes are moist.  Eyes:     Pupils: Pupils are equal, round, and reactive to light.  Cardiovascular:     Rate and Rhythm: Normal rate and regular rhythm.     Pulses: Normal pulses.     Heart sounds: Normal heart sounds.  Pulmonary:     Effort: Pulmonary effort is normal.     Breath sounds: Normal breath sounds. No wheezing.  Abdominal:     Palpations: Abdomen is soft.  Musculoskeletal:  Cervical back: Neck supple.     Comments: Right sided rib cage area pain on palpation.  Skin:    Capillary Refill: Capillary refill takes less than 2 seconds.  Neurological:     Mental Status: She is alert and oriented to person, place, and time.  Psychiatric:        Mood and Affect: Mood normal.      Labs on Admission: I have personally reviewed following labs and imaging studies  CBC: Recent Labs  Lab 06/04/24 2302  WBC 9.6  HGB 13.5  HCT 41.3  MCV 91.0  PLT 242   Basic Metabolic Panel: Recent Labs  Lab 06/04/24 2302  NA 139  K 4.1  CL 102  CO2 23  GLUCOSE 126*  BUN 26*  CREATININE 0.82  CALCIUM  9.7   GFR: CrCl cannot be calculated (Unknown ideal weight.). Liver Function Tests: No results for input(s): AST, ALT, ALKPHOS, BILITOT, PROT, ALBUMIN in the last 168 hours. No results for input(s): LIPASE, AMYLASE in the last 168 hours. No results for input(s): AMMONIA in the last 168 hours. Coagulation Profile: No results for input(s): INR, PROTIME in the last 168 hours. Cardiac Enzymes: No results for input(s): CKTOTAL, CKMB, CKMBINDEX, TROPONINI, TROPONINIHS in the last 168 hours. BNP (last 3 results) No results for input(s): BNP in the last 8760 hours. HbA1C: No results for input(s): HGBA1C in the last 72 hours. CBG: No results for input(s): GLUCAP in the last 168 hours. Lipid Profile: No results for input(s): CHOL, HDL, LDLCALC, TRIG, CHOLHDL,  LDLDIRECT in the last 72 hours. Thyroid  Function Tests: No results for input(s): TSH, T4TOTAL, FREET4, T3FREE, THYROIDAB in the last 72 hours. Anemia Panel: No results for input(s): VITAMINB12, FOLATE, FERRITIN, TIBC, IRON, RETICCTPCT in the last 72 hours. Urine analysis:    Component Value Date/Time   COLORURINE YELLOW 06/05/2022 1159   APPEARANCEUR CLEAR 06/05/2022 1159   LABSPEC 1.042 (H) 06/05/2022 1159   PHURINE 6.0 06/05/2022 1159   GLUCOSEU NEGATIVE 06/05/2022 1159   HGBUR SMALL (A) 06/05/2022 1159   BILIRUBINUR NEGATIVE 06/05/2022 1159   KETONESUR 5 (A) 06/05/2022 1159   PROTEINUR NEGATIVE 06/05/2022 1159   UROBILINOGEN 0.2 03/12/2011 1541   NITRITE NEGATIVE 06/05/2022 1159   LEUKOCYTESUR LARGE (A) 06/05/2022 1159    Radiological Exams on Admission: I have personally reviewed images CT Chest W Contrast Result Date: 06/05/2024 EXAM: CT CHEST WITH CONTRAST 06/05/2024 12:55:21 AM TECHNIQUE: CT of the chest was performed with the administration of 75 mL of iohexol  (OMNIPAQUE ) 300 MG/ML solution. Multiplanar reformatted images are provided for review. Automated exposure control, iterative reconstruction, and/or weight based adjustment of the mA/kV was utilized to reduce the radiation dose to as low as reasonably achievable. COMPARISON: Same day x-ray. CLINICAL HISTORY: Chest trauma, blunt; assess rib fractures, r/o ptx, r/o scapula fracture. FINDINGS: MEDIASTINUM: Aortic and coronary artery atherosclerotic calcifications. Heart and pericardium are otherwise unremarkable. The central airways are clear. LYMPH NODES: No mediastinal, hilar or axillary lymphadenopathy. LUNGS AND PLEURA: Chronic scarring in the lower lungs. No focal consolidation or pulmonary edema. No pleural effusion or pneumothorax. SOFT TISSUES/BONES: Mildly displaced fracture of the posterior lateral right 5th rib. Nondisplaced fracture of the posterior right 6th rib near the costovertebral  junction. Mildly displaced fracture of the posterior right 7th rib near the costovertebral junction. Additional nondisplaced fracture of the posterior right 7th rib. Nondisplaced fracture of the posterior right 8th rib near the costovertebral junction and nondisplaced additional posterior right 8th rib fracture. Nondisplaced fractures of  the posterior right 9th and 10th ribs near the costovertebral junction. No scapular fracture. Thoracic kyphosis. Chronic compression fractures of T8 and T9. Chronic fracture of T9. Vertebroplasty of T9. No acute abnormality of the soft tissues. UPPER ABDOMEN: Limited images of the upper abdomen demonstrates no acute abnormality. IMPRESSION: 1. Fractures of the posterior right 5th - 10th ribs as described. No scapular fracture or pneumothorax. Electronically signed by: Norman Gatlin MD 06/05/2024 01:08 AM EST RP Workstation: [id number]   DG Lumbar Spine Complete Result Date: 06/04/2024 EXAM: 4 OR MORE VIEW(S) XRAY OF THE LUMBAR SPINE 06/04/2024 10:21:00 PM COMPARISON: Comparison with 06/30/2022 CT. CLINICAL HISTORY: fall, pain FINDINGS: LUMBAR SPINE: BONES: Vertebral body heights are maintained. Left convex lumbar curve. Demineralization. DISCS AND DEGENERATIVE CHANGES: Moderate lower lumbar facet arthropathy. SOFT TISSUES: No acute abnormality. IMPRESSION: 1. No evidence of acute traumatic injury. Electronically signed by: Norman Gatlin MD 06/04/2024 10:30 PM EST RP Workstation: [id number]   DG Pelvis 1-2 Views Result Date: 06/04/2024 EXAM: 1 OR 2 VIEW(S) XRAY OF THE PELVIS 06/04/2024 10:21:00 PM COMPARISON: None available. CLINICAL HISTORY: fall, pain FINDINGS: BONES AND JOINTS: No acute fracture. No malalignment. Degenerative changes of the lower lumbar spine. Degenerative changes of the hips, left greater than right. SOFT TISSUES: Surgical clip overlies the right hemipelvis. IMPRESSION: 1. No acute fracture. Electronically signed by: Norman Gatlin MD 06/04/2024 10:29  PM EST RP Workstation: [id number]   DG Chest 2 View Result Date: 06/04/2024 CLINICAL DATA:  Right-sided rib fractures EXAM: DG CHEST 2V COMPARISON:  06/04/2024, 05/12/2024, chest CT 01/13/2023 FINDINGS: Aortic atherosclerosis. Mild cardiomegaly. Multiple acute appearing right sided rib fractures. No convincing pneumothorax. Slight hazy asymmetrical appearance of the right thorax compared to left, but no definitive pleural effusion on lateral view. Multiple chronic compression deformities of the thoracic spine with post augmentation change. IMPRESSION: 1. Multiple acute displaced right rib fractures without definitive pneumothorax by radiography. 2. Slight asymmetrical hazy opacification of right thorax compared to left, without definite pleural fluid correlate on lateral view, could be secondary to soft tissue artifact, however given multiple right rib fractures, suggest correlation with chest CT. Electronically Signed   By: Luke Bun M.D.   On: 06/04/2024 20:30    Assessment/Plan: Principal Problem:   Multiple rib fractures Active Problems:   Right rib fracture   GERD (gastroesophageal reflux disease)   IBS (irritable bowel syndrome)   Essential hypertension   Fall at home, initial encounter   Chronic vertigo    Assessment and Plan: Right-sided rib fracture 5th to 9th secondary to mechanical fall while tying shoelaces Presented emergency department complaining of mechanical fall found to have right-sided multiple rib fracture 5th-9th rib at urgent care and patient was referred to emergency department for further evaluation. - At presentation to ED patient is hemodynamically stable.  O2 sat 96 to 97% room air. Lab work, CBC unremarkable.  BMP unremarkable.  - Imaging, chest x-ray:  Multiple acute displaced right rib fractures without definitive pneumothorax by radiography. 2. Slight asymmetrical hazy opacification of right thorax compared to left, without definite pleural fluid  correlate on lateral view, could be secondary to soft tissue artifact, however given multiple right rib fractures, suggest correlation with chest CT. - CT chest: Fractures of the posterior right 5th - 10th ribs as described. No scapular fracture or pneumothorax. - X-ray lumbar spine no acute traumatic injury.  X-ray pelvis no traumatic injury. -In the ED patient received Zofran  and morphine . Assessment and plan -Pneumothorax has been ruled out. -Continue multimodality pain  control with Tylenol  as needed, scheduled Toradol  7.5 mg 6-hour for 1 day, continue lidocaine  patch and fentanyl  as needed for moderate to severe pain control.  Continue Robaxin  as needed. - Continue to check pulse ox, continue supplemental oxygen, pulmonary toiletry and spirometry. - Consulted PT and OT.   GERD -Continue Protonix    Essential hypertension -Continue Coreg  and losartan   Chronic vertigo/dizziness - Continue meclizine  as needed  DVT prophylaxis:  SQ Heparin  Code Status:  Full Code Diet: Heart healthy diet Family Communication:   Family was present at bedside, at the time of interview. Opportunity was given to ask question and all questions were answered satisfactorily.  Disposition Plan: Continue to monitor improvement of rib cage pain. Consults: PT and OT Admission status:   Inpatient, Telemetry bed  Severity of Illness: The appropriate patient status for this patient is INPATIENT. Inpatient status is judged to be reasonable and necessary in order to provide the required intensity of service to ensure the patient's safety. The patient's presenting symptoms, physical exam findings, and initial radiographic and laboratory data in the context of their chronic comorbidities is felt to place them at high risk for further clinical deterioration. Furthermore, it is not anticipated that the patient will be medically stable for discharge from the hospital within 2 midnights of admission.   * I certify that at  the point of admission it is my clinical judgment that the patient will require inpatient hospital care spanning beyond 2 midnights from the point of admission due to high intensity of service, high risk for further deterioration and high frequency of surveillance required.DEWAINE    Andelyn Spade, MD [facility]  How to contact the [facility] Attending or Consulting provider 7A - 7P or covering provider during after hours 7P -7A, for this patient.  Check the care team in [facility] and look for a) attending/consulting [facility] provider listed and b) the [facility] team listed Log into [email or web address] and use [facility]'s universal password to access. If you do not have the password, please contact the hospital operator. Locate the [facility] provider you are looking for under [facility] and page to a number that you can be directly reached. If you still have difficulty reaching the provider, please page the [facility] (Director on Call) for the Hospitalists listed on amion for assistance.  06/05/2024, 3:05 AM           [1]  Allergies Allergen Reactions   Fosamax [Alendronate Sodium] Other (See Comments)    GERDS   Boniva [Ibandronate] Diarrhea   Evista [Raloxifene] Diarrhea   Levbid [Hyoscyamine] Other (See Comments)    Cramps   Nexium [Esomeprazole Magnesium] Diarrhea   Norco [Hydrocodone -Acetaminophen ] Other (See Comments)    Severe constipation    Prevacid [Lansoprazole] Diarrhea   Morphine  And Codeine Other (See Comments)    Made patient very dizzy   "

## 2024-06-06 DIAGNOSIS — S2241XA Multiple fractures of ribs, right side, initial encounter for closed fracture: Secondary | ICD-10-CM | POA: Diagnosis not present

## 2024-06-06 DIAGNOSIS — W19XXXA Unspecified fall, initial encounter: Secondary | ICD-10-CM | POA: Diagnosis not present

## 2024-06-06 DIAGNOSIS — R42 Dizziness and giddiness: Secondary | ICD-10-CM | POA: Diagnosis not present

## 2024-06-06 DIAGNOSIS — I10 Essential (primary) hypertension: Secondary | ICD-10-CM | POA: Diagnosis not present

## 2024-06-06 MED ORDER — POLYETHYLENE GLYCOL 3350 17 GM/SCOOP PO POWD: 17.0000 g | Freq: Two times a day (BID) | ORAL | 1 refills | Status: AC | PRN

## 2024-06-06 MED ORDER — HYDROMORPHONE HCL 1 MG/ML IJ SOLN
0.2500 mg | INTRAMUSCULAR | Status: DC | PRN
  Administered 2024-06-12: 0.25 mg via INTRAVENOUS

## 2024-06-06 MED ORDER — TYLENOL 8 HOUR 650 MG PO TBCR: EXTENDED_RELEASE_TABLET | ORAL | Status: AC

## 2024-06-06 MED ORDER — OXYCODONE HCL 5 MG PO TABS
5.0000 mg | ORAL_TABLET | Freq: Four times a day (QID) | ORAL | Status: DC | PRN
  Administered 2024-06-06 – 2024-06-12 (×9): 5 mg via ORAL
  Filled 2024-06-06 (×5): qty 1

## 2024-06-06 MED ORDER — OXYCODONE HCL 5 MG PO TABS
2.5000 mg | ORAL_TABLET | Freq: Four times a day (QID) | ORAL | Status: DC | PRN
  Administered 2024-06-10: 2.5 mg via ORAL

## 2024-06-06 MED ORDER — SENNOSIDES-DOCUSATE SODIUM 8.6-50 MG PO TABS: 1.0000 | ORAL_TABLET | Freq: Two times a day (BID) | ORAL | Status: AC | PRN

## 2024-06-06 NOTE — Plan of Care (Signed)
  Problem: Coping: Goal: Level of anxiety will decrease Outcome: Progressing   Problem: Pain Managment: Goal: General experience of comfort will improve and/or be controlled Outcome: Progressing   Problem: Safety: Goal: Ability to remain free from injury will improve Outcome: Progressing   Problem: Skin Integrity: Goal: Risk for impaired skin integrity will decrease Outcome: Progressing

## 2024-06-06 NOTE — Plan of Care (Signed)

## 2024-06-06 NOTE — Progress Notes (Signed)
 " PROGRESS NOTE  Yolanda Phillips FMW:[id number] DOB: 1930/11/19   PCP: Leonel Cole, MD  Patient is from: Home.  DOA: 06/04/2024 LOS: 1  Chief complaints Chief Complaint  Patient presents with   Fall     Brief Narrative / Interim history: 89 year old F with PMH of HTN, chronic dizziness, osteoporosis, osteoarthritis, diverticulosis, GERD, IBS, HLD and nephrolithiasis sent to ED from urgent care after she was found to have 6 rib fractures.  Reportedly had accidental fall after hip/shoe code on the floor at home.  No prodromes leading to fall.  Denies hitting head or LOC.  Chest x-ray and CT chest showed fractures of posterior right 5th-10th ribs with minimal displacement.  Pelvic and lumbar x-ray without acute finding.  Patient was admitted for pain control and therapy evaluation.  Subjective: Seen and examined earlier this morning.  No major events overnight or this morning.  Continues to endorse significant pain in her right upper back.  Rates her pain 6-10 on a scale of 10.  Worse with movement and deep breathing.  Feels confused.    Assessment and plan: Accidental fall at home-reportedly fell after his shoe/foot was was caught on the floor Acute right-sided rib fracture 5th to 10th due to fall.  No flail chest or pneumothorax.  Vitamin D  level normal. -On room air.  Continue pulmonary toilet -Tylenol  650 mg every 6 hours while awake -Oxycodone  2.5 mg every 6 hours as needed moderate.  -Oxycodone  5 mg every 6 hours severe pain -IV Dilaudid  0.25 mg every 3 hours as needed breakthrough pain -Bowel regimen for constipation. -Fall precaution, PT/OT  Essential hypertension: Markedly elevated BP likely due to pain.  Improved. -Pain control as above. -Continue home Coreg  and losartan    GERD -Continue Protonix    Chronic vertigo/dizziness: Denies. - Hold meclizine .  Body mass index is 24.24 kg/m.          DVT prophylaxis:  heparin  injection 5,000 Units Start: 06/05/24  0600 SCDs Start: 06/05/24 0157 Place TED hose Start: 06/05/24 0157  Code Status: Full code Family Communication: None at bedside Level of care: Telemetry Status is: Inpatient Remains inpatient appropriate because: Fall at home, traumatic rib fracture   Final disposition: To be determined   35 minutes with more than 50% spent in reviewing records, counseling patient/family and coordinating care.  Consultants:  None  Procedures: None  Microbiology summarized: None  Objective: Vitals:   06/05/24 2049 06/06/24 0535 06/06/24 0912 06/06/24 1333  BP: 138/60 (!) 168/70 (!) 145/74 (!) 153/61  Pulse: 61 (!) 58 (!) 57 66  Resp: 14 14  18   Temp: 97.6 F (36.4 C) 97.7 F (36.5 C) 97.7 F (36.5 C) (!) 97.5 F (36.4 C)  TempSrc: Oral Oral Oral Oral  SpO2: 95% 90%  93%  Weight:      Height:        Examination:  GENERAL: No apparent distress.  Nontoxic. HEENT: MMM.  Vision and hearing grossly intact.  NECK: Supple.  No apparent JVD.  RESP:  No IWOB.  Fair aeration bilaterally. CVS:  RRR. Heart sounds normal.  ABD/GI/GU: BS+. Abd soft, NTND.  MSK/EXT:  Moves extremities. No apparent deformity. No edema.  SKIN: no apparent skin lesion or wound NEURO: Awake and alert.  Oriented x 4.  No apparent focal neuro deficit. PSYCH: Calm. Normal affect.   Sch Meds:  Scheduled Meds:  acetaminophen   650 mg Oral Q6H WA   calcium -vitamin D   1 tablet Oral BID   carvedilol   12.5  mg Oral BID   heparin   5,000 Units Subcutaneous Q8H   lidocaine   1 patch Transdermal Q24H   losartan   12.5 mg Oral Daily   multivitamin with minerals  1 tablet Oral Daily   ondansetron  (ZOFRAN ) IV  4 mg Intravenous Once   pantoprazole   40 mg Oral QODAY   sodium chloride  flush  3 mL Intravenous Q12H   sodium chloride  flush  3 mL Intravenous Q12H   Continuous Infusions:   PRN Meds:.HYDROmorphone  (DILAUDID ) injection, methocarbamol  (ROBAXIN ) injection, ondansetron  **OR** ondansetron  (ZOFRAN ) IV, oxyCODONE ,  polyethylene glycol, senna-docusate, sodium chloride  flush  Antimicrobials: Anti-infectives (From admission, onward)    None        I have personally reviewed the following labs and images: CBC: Recent Labs  Lab 06/04/24 2302 06/05/24 0554  WBC 9.6 8.6  HGB 13.5 11.9*  HCT 41.3 36.4  MCV 91.0 90.1  PLT 242 216   BMP &GFR Recent Labs  Lab 06/04/24 2302 06/05/24 0554  NA 139 138  K 4.1 3.8  CL 102 103  CO2 23 23  GLUCOSE 126* 119*  BUN 26* 22  CREATININE 0.82 0.82  CALCIUM  9.7 9.1   Estimated Creatinine Clearance: 32.2 mL/min (by C-G formula based on SCr of 0.82 mg/dL). Liver & Pancreas: Recent Labs  Lab 06/05/24 0554  AST 28  ALT 16  ALKPHOS 41  BILITOT 0.8  PROT 6.0*  ALBUMIN 3.6   No results for input(s): LIPASE, AMYLASE in the last 168 hours. No results for input(s): AMMONIA in the last 168 hours. Diabetic: No results for input(s): HGBA1C in the last 72 hours. No results for input(s): GLUCAP in the last 168 hours. Cardiac Enzymes: No results for input(s): CKTOTAL, CKMB, CKMBINDEX, TROPONINI in the last 168 hours. No results for input(s): PROBNP in the last 8760 hours. Coagulation Profile: No results for input(s): INR, PROTIME in the last 168 hours. Thyroid  Function Tests: No results for input(s): TSH, T4TOTAL, FREET4, T3FREE, THYROIDAB in the last 72 hours. Lipid Profile: No results for input(s): CHOL, HDL, LDLCALC, TRIG, CHOLHDL, LDLDIRECT in the last 72 hours. Anemia Panel: No results for input(s): VITAMINB12, FOLATE, FERRITIN, TIBC, IRON, RETICCTPCT in the last 72 hours. Urine analysis:    Component Value Date/Time   COLORURINE YELLOW 06/05/2022 1159   APPEARANCEUR CLEAR 06/05/2022 1159   LABSPEC 1.042 (H) 06/05/2022 1159   PHURINE 6.0 06/05/2022 1159   GLUCOSEU NEGATIVE 06/05/2022 1159   HGBUR SMALL (A) 06/05/2022 1159   BILIRUBINUR NEGATIVE 06/05/2022 1159   KETONESUR 5 (A)  06/05/2022 1159   PROTEINUR NEGATIVE 06/05/2022 1159   UROBILINOGEN 0.2 03/12/2011 1541   NITRITE NEGATIVE 06/05/2022 1159   LEUKOCYTESUR LARGE (A) 06/05/2022 1159   Sepsis Labs: Invalid input(s): PROCALCITONIN, LACTICIDVEN  Microbiology: No results found for this or any previous visit (from the past 240 hours).  Radiology Studies: No results found.     Adasha Boehme T. Waverley Krempasky [facility]  If 7PM-7AM, please contact night-coverage [email or web address] 06/06/2024, 2:26 PM   "

## 2024-06-07 DIAGNOSIS — S2241XA Multiple fractures of ribs, right side, initial encounter for closed fracture: Secondary | ICD-10-CM | POA: Diagnosis not present

## 2024-06-07 DIAGNOSIS — R42 Dizziness and giddiness: Secondary | ICD-10-CM | POA: Diagnosis not present

## 2024-06-07 DIAGNOSIS — W19XXXA Unspecified fall, initial encounter: Secondary | ICD-10-CM | POA: Diagnosis not present

## 2024-06-07 DIAGNOSIS — I10 Essential (primary) hypertension: Secondary | ICD-10-CM | POA: Diagnosis not present

## 2024-06-07 MED ORDER — ORAL CARE MOUTH RINSE: 15.0000 mL | OROMUCOSAL | Status: DC | PRN

## 2024-06-07 NOTE — Plan of Care (Signed)
   Problem: Education: Goal: Knowledge of General Education information will improve Description: Including pain rating scale, medication(s)/side effects and non-pharmacologic comfort measures Outcome: Progressing   Problem: Elimination: Goal: Will not experience complications related to bowel motility Outcome: Progressing   Problem: Pain Managment: Goal: General experience of comfort will improve and/or be controlled Outcome: Progressing   Problem: Safety: Goal: Ability to remain free from injury will improve Outcome: Progressing

## 2024-06-07 NOTE — Progress Notes (Signed)
 Physical Therapy Treatment Patient Details Name: Yolanda Phillips MRN: [id number] DOB: 1931/05/20 Today's Date: 06/07/2024   History of Present Illness 89 yo female admitted with R side posterior rib fxs #5-10 after sustaining a fall at home. Hx of essential HTN, nephrolithiasis, chronic dizziness, chronic back pain, osteoporosis    PT Comments  Pt very cooperative but progress this date limited by increased pain and soreness per pt (despite premed).  Pt currently requiring assist for all basic mobility tasks and with noted decreased endurance and balance.  Patient will benefit from continued inpatient follow up therapy, <3 hours/day to maximize IND and safety prior to return home with limited assist.     If plan is discharge home, recommend the following: A lot of help with walking and/or transfers;A little help with bathing/dressing/bathroom;Assistance with cooking/housework;Assist for transportation;Help with stairs or ramp for entrance   Can travel by private vehicle     No  Equipment Recommendations  None recommended by PT    Recommendations for Other Services       Precautions / Restrictions Precautions Precautions: Fall Restrictions Weight Bearing Restrictions Per Provider Order: No     Mobility  Bed Mobility Overal bed mobility: Needs Assistance Bed Mobility: Supine to Sit     Supine to sit: Min assist, HOB elevated, Used rails     General bed mobility comments: Increased time, assist to bring trunk to upright and complete rotation to EOB sitting.    Transfers Overall transfer level: Needs assistance Equipment used: Rolling walker (2 wheels) Transfers: Sit to/from Stand Sit to Stand: From elevated surface, Min assist           General transfer comment: steadying assist to bring wt up and fwd and balance in initial standing with RW    Ambulation/Gait Ambulation/Gait assistance: Min assist Gait Distance (Feet): 10 Feet Assistive device: Rolling walker (2  wheels) Gait Pattern/deviations: Step-to pattern, Decreased step length - right, Decreased step length - left, Shuffle, Trunk flexed Gait velocity: decr     General Gait Details: Increased time, steady assist, cues for position from [employer] Mobility     Tilt Bed    Modified Rankin (Stroke Patients Only)       Balance Overall balance assessment: Needs assistance Sitting-balance support: No upper extremity supported, Feet supported Sitting balance-Leahy Scale: Fair     Standing balance support: No upper extremity supported Standing balance-Leahy Scale: Fair                              Hotel Manager: Impaired Factors Affecting Communication: Hearing impaired  Cognition Arousal: Alert Behavior During Therapy: WFL for tasks assessed/performed   PT - Cognitive impairments: No apparent impairments                         Following commands: Intact      Cueing Cueing Techniques: Verbal cues  Exercises      General Comments        Pertinent Vitals/Pain Pain Assessment Pain Assessment: 0-10 Pain Score: 6  Pain Location: R ribs and back with movement Pain Descriptors / Indicators: Grimacing, Guarding, Sore Pain Intervention(s): Limited activity within patient's tolerance, Monitored during session, Premedicated before session    Home Living  Prior Function            PT Goals (current goals can now be found in the care plan section) Acute Rehab PT Goals Patient Stated Goal: less pain. return to PLOF PT Goal Formulation: With patient Time For Goal Achievement: 06/19/24 Potential to Achieve Goals: Good Progress towards PT goals: Not progressing toward goals - comment (Pain limited)    Frequency    Min 3X/week      PT Plan      Co-evaluation              AM-PAC PT 6 Clicks Mobility   Outcome Measure  Help needed turning  from your back to your side while in a flat bed without using bedrails?: A Lot Help needed moving from lying on your back to sitting on the side of a flat bed without using bedrails?: A Lot Help needed moving to and from a bed to a chair (including a wheelchair)?: A Lot Help needed standing up from a chair using your arms (e.g., wheelchair or bedside chair)?: A Little Help needed to walk in hospital room?: A Little Help needed climbing 3-5 steps with a railing? : A Lot 6 Click Score: 14    End of Session Equipment Utilized During Treatment: Gait belt Activity Tolerance: Patient limited by fatigue;Patient limited by pain Patient left: in chair;with call bell/phone within reach;with chair alarm set Nurse Communication: Mobility status PT Visit Diagnosis: Unsteadiness on feet (R26.81);History of falling (Z91.81);Difficulty in walking, not elsewhere classified (R26.2)     Time: 8857-8841 PT Time Calculation (min) (ACUTE ONLY): 16 min  Charges:    $Therapeutic Activity: 8-22 mins PT General Charges $$ ACUTE PT VISIT: 1 Visit                     [facility] PT Acute Rehabilitation Services Office [phone]    Awais Cobarrubias 06/07/2024, 12:43 PM

## 2024-06-07 NOTE — TOC Initial Note (Signed)
 Transition of Care [facility]) - Initial/Assessment Note    Patient Details  Name: LENNIS RADER MRN: [id number] Date of Birth: Jun 21, 1930  Transition of Care [facility]) CM/SW Contact:    Tawni CHRISTELLA Eva, LCSW Phone Number: 06/07/2024, 3:42 PM  Clinical Narrative:                  CSW met with the pt at bedside to discuss the recommendation for SNF placement. The pt is agreeable and would like to stay in the [place] area. CSW explained the process and noted that she will need insurance approval to go. The pt verbalized understanding. The pt reports that her insurance changes over to [facility] on the 1st. CSW explained that both insurance require authorization prior to admitting to the facility. CSW to fax the pt out for SNF placement. ICM to follow.  Expected Discharge Plan: Home w Home Health Services Barriers to Discharge: Continued Medical Work up   Patient Goals and CMS Choice Patient states their goals for this hospitalization and ongoing recovery are:: SNF to get stronger CMS Medicare.gov Compare Post Acute Care list provided to:: Patient Choice offered to / list presented to : Patient      Expected Discharge Plan and Services       Living arrangements for the past 2 months: Single Family Home                                      Prior Living Arrangements/Services Living arrangements for the past 2 months: Single Family Home Lives with:: Self Patient language and need for interpreter reviewed:: Yes Do you feel safe going back to the place where you live?: Yes      Need for Family Participation in Patient Care: No (Comment) Care giver support system in place?: No (comment)   Criminal Activity/Legal Involvement Pertinent to Current Situation/Hospitalization: No - Comment as needed  Activities of Daily Living   ADL Screening (condition at time of admission) Independently performs ADLs?: Yes (appropriate for developmental age) Is the patient deaf or have  difficulty hearing?: Yes Does the patient have difficulty seeing, even when wearing glasses/contacts?: No Does the patient have difficulty concentrating, remembering, or making decisions?: No  Permission Sought/Granted                  Emotional Assessment Appearance:: Appears stated age Attitude/Demeanor/Rapport: Gracious Affect (typically observed): Accepting Orientation: : Oriented to Self, Oriented to Place, Oriented to  Time, Oriented to Situation      Admission diagnosis:  Multiple rib fractures [S22.49XA] Contusion of right back wall of thorax, initial encounter [S20.221A] Fall from slip, trip, or stumble, initial encounter [W01.0XXA] Closed fracture of multiple ribs of right side, initial encounter [S22.41XA] Patient Active Problem List   Diagnosis Date Noted   Right rib fracture 06/05/2024   Essential hypertension 06/05/2024   Multiple rib fractures 06/05/2024   Fall at home, initial encounter 06/05/2024   Chronic vertigo 06/05/2024   Atypical chest pain 03/29/2013   Chest pain 03/26/2013   GERD (gastroesophageal reflux disease)    Osteoporosis    Diverticulosis    PONV (postoperative nausea and vomiting)    Costochondritis    IBS (irritable bowel syndrome)    PCP:  Leonel Cole, MD Pharmacy:   [phone] [place], [place] - [address] AT [address] [facility] [address] [place] [address] Phone: [phone] Fax: [phone]  Social Drivers of Health (SDOH) Social History: SDOH Screenings   Food Insecurity: No Food Insecurity (06/05/2024)  Housing: Low Risk (06/05/2024)  Transportation Needs: No Transportation Needs (06/05/2024)  Utilities: Not At Risk (06/05/2024)  Social Connections: Moderately Integrated (06/05/2024)  Tobacco Use: Low Risk (06/05/2024)   SDOH Interventions:     Readmission Risk Interventions     No data to display

## 2024-06-07 NOTE — NC FL2 (Signed)
 " [place]  MEDICAID FL2 LEVEL OF CARE FORM     IDENTIFICATION  Patient Name: Yolanda Phillips Birthdate: [birth year] Sex: female Admission Date (Current Location): 06/04/2024  [facility] and [place] Number:  Producer, Television/film/video and Address:  [facility],  [address], [place] [address]      Provider Number: [id number]  Attending Physician Name and Address:  Kathrin Mignon DASEN, MD  Relative Name and Phone Number:       Current Level of Care: Hospital Recommended Level of Care: Skilled Nursing Facility Prior Approval Number:    Date Approved/Denied:   PASRR Number: [id number]  Discharge Plan: SNF    Current Diagnoses: Patient Active Problem List   Diagnosis Date Noted   Right rib fracture 06/05/2024   Essential hypertension 06/05/2024   Multiple rib fractures 06/05/2024   Fall at home, initial encounter 06/05/2024   Chronic vertigo 06/05/2024   Atypical chest pain 03/29/2013   Chest pain 03/26/2013   GERD (gastroesophageal reflux disease)    Osteoporosis    Diverticulosis    PONV (postoperative nausea and vomiting)    Costochondritis    IBS (irritable bowel syndrome)     Orientation RESPIRATION BLADDER Height & Weight     Self, Time, Situation, Place  Normal Continent Weight: 116 lb (52.6 kg) Height:  4' 10 (147.3 cm)  BEHAVIORAL SYMPTOMS/MOOD NEUROLOGICAL BOWEL NUTRITION STATUS      Continent Diet (heart healthy)  AMBULATORY STATUS COMMUNICATION OF NEEDS Skin   Limited Assist Verbally Normal                       Personal Care Assistance Level of Assistance  Bathing, Feeding, Dressing Bathing Assistance: Limited assistance Feeding assistance: Independent Dressing Assistance: Limited assistance     Functional Limitations Info  Sight, Hearing, Speech Sight Info: Adequate Hearing Info: Impaired (Left side hearing aid) Speech Info: Adequate    SPECIAL CARE FACTORS FREQUENCY  OT (By licensed OT), PT (By licensed PT)     PT Frequency: 5  x a week OT Frequency: 5 x a week            Contractures Contractures Info: Not present    Additional Factors Info  Code Status, Allergies Code Status Info: full Allergies Info: Fosamax (Alendronate Sodium)  Amlodipine  Boniva (Ibandronate)  Bupropion Hcl Er (Xl)  Ciprofloxacin  Doxycycline   Evista (Raloxifene)  Hydrochlorothiazide  Levbid (Hyoscyamine)  Nexium (Esomeprazole Magnesium)  Norco (Hydrocodone -acetaminophen )  Olmesartan  Other  Prevacid (Lansoprazole)  Sulfamethoxazole-trimethoprim  Morphine  And Codeine           Current Medications (06/07/2024):  This is the current hospital active medication list Current Facility-Administered Medications  Medication Dose Route Frequency Provider Last Rate Last Admin   acetaminophen  (TYLENOL ) tablet 650 mg  650 mg Oral Q6H WA Gonfa, Taye T, MD   650 mg at 06/07/24 1333   calcium -vitamin D  (OSCAL WITH D) 500-5 MG-MCG per tablet 1 tablet  1 tablet Oral BID Sundil, Subrina, MD   1 tablet at 06/07/24 0852   carvedilol  (COREG ) tablet 12.5 mg  12.5 mg Oral BID Sundil, Subrina, MD   12.5 mg at 06/07/24 9147   heparin  injection 5,000 Units  5,000 Units Subcutaneous Q8H Sundil, Subrina, MD   5,000 Units at 06/07/24 1333   HYDROmorphone  (DILAUDID ) injection 0.25 mg  0.25 mg Intravenous Q4H PRN Gonfa, Taye T, MD       lidocaine  (LIDODERM ) 5 % 1 patch  1 patch  Transdermal Q24H Sundil, Subrina, MD   1 patch at 06/07/24 0149   losartan  (COZAAR ) tablet 12.5 mg  12.5 mg Oral Daily Sundil, Subrina, MD   12.5 mg at 06/07/24 9146   methocarbamol  (ROBAXIN ) injection 500 mg  500 mg Intravenous Q6H PRN Sundil, Subrina, MD       multivitamin with minerals tablet 1 tablet  1 tablet Oral Daily Sundil, Subrina, MD   1 tablet at 06/07/24 0853   ondansetron  (ZOFRAN ) injection 4 mg  4 mg Intravenous Once Sundil, Subrina, MD       ondansetron  (ZOFRAN ) tablet 4 mg  4 mg Oral Q6H PRN Sundil, Subrina, MD       Or   ondansetron  (ZOFRAN ) injection 4 mg  4 mg Intravenous  Q6H PRN Sundil, Subrina, MD   4 mg at 06/06/24 0740   Oral care mouth rinse  15 mL Mouth Rinse PRN Gonfa, Taye T, MD       oxyCODONE  (Oxy IR/ROXICODONE ) immediate release tablet 2.5 mg  2.5 mg Oral Q6H PRN Gonfa, Taye T, MD       oxyCODONE  (Oxy IR/ROXICODONE ) immediate release tablet 5 mg  5 mg Oral Q6H PRN Gonfa, Taye T, MD   5 mg at 06/06/24 2053   pantoprazole  (PROTONIX ) EC tablet 40 mg  40 mg Oral QODAY Sundil, Subrina, MD   40 mg at 06/07/24 0853   polyethylene glycol (MIRALAX  / GLYCOLAX ) packet 17 g  17 g Oral BID PRN Gonfa, Taye T, MD   17 g at 06/06/24 0944   senna-docusate (Senokot-S) tablet 1 tablet  1 tablet Oral BID PRN Gonfa, Taye T, MD   1 tablet at 06/07/24 1333   sodium chloride  flush (NS) 0.9 % injection 3 mL  3 mL Intravenous Q12H Sundil, Subrina, MD   3 mL at 06/07/24 9146   sodium chloride  flush (NS) 0.9 % injection 3 mL  3 mL Intravenous Q12H Sundil, Subrina, MD   3 mL at 06/07/24 0853   sodium chloride  flush (NS) 0.9 % injection 3 mL  3 mL Intravenous PRN Sundil, Subrina, MD         Discharge Medications: Please see discharge summary for a list of discharge medications.  Relevant Imaging Results:  Relevant Lab Results:   Additional Information SSN [id number]  Tawni HERO Evaan Tidwell, LCSW     "

## 2024-06-07 NOTE — Progress Notes (Signed)
 " PROGRESS NOTE  Yolanda Phillips FMW:[id number] DOB: 03/18/1931   PCP: Leonel Cole, MD  Patient is from: Home.  DOA: 06/04/2024 LOS: 2  Chief complaints Chief Complaint  Patient presents with   Fall     Brief Narrative / Interim history: 89 year old F with PMH of HTN, chronic dizziness, osteoporosis, osteoarthritis, diverticulosis, GERD, IBS, HLD and nephrolithiasis sent to ED from urgent care after she was found to have 6 rib fractures.  Reportedly had accidental fall after hip/shoe code on the floor at home.  No prodromes leading to fall.  Denies hitting head or LOC.  Chest x-ray and CT chest showed fractures of posterior right 5th-10th ribs with minimal displacement.  Pelvic and lumbar x-ray without acute finding.  Patient was admitted for pain control and therapy evaluation.  Therapy recommended SNF.  Stable for discharge.  Subjective: Seen and examined earlier this morning.  No major events overnight or this morning.  Continues to endorse right upper back pain from broken ribs.  She rates her pain 5/10 this morning.  Denies shortness of breath.   Assessment and plan: Accidental fall at home-reportedly fell after his shoe/foot was was caught on the floor Acute right-sided rib fracture 5th to 10th due to fall.  No flail chest or pneumothorax.  Vitamin D  level normal. -On room air.  Continue pulmonary toilet -Tylenol  650 mg every 6 hours while awake -Oxycodone  2.5 mg every 6 hours as needed moderate.  -Oxycodone  5 mg every 6 hours severe pain -IV Dilaudid  0.25 mg every 3 hours as needed breakthrough pain -Bowel regimen for constipation. -Fall precaution,  -PT/OT recommended SNF.  Essential hypertension: Markedly elevated BP likely due to pain.  Improved. -Pain control as above. -Continue home Coreg  and losartan    GERD -Continue Protonix    Chronic vertigo/dizziness: Denies. - Hold meclizine .  Body mass index is 24.24 kg/m.          DVT prophylaxis:  heparin   injection 5,000 Units Start: 06/05/24 0600 SCDs Start: 06/05/24 0157 Place TED hose Start: 06/05/24 0157  Code Status: Full code Family Communication: None at bedside Level of care: Telemetry Status is: Inpatient Remains inpatient appropriate because: Fall at home, traumatic rib fracture   Final disposition: SNF   35 minutes with more than 50% spent in reviewing records, counseling patient/family and coordinating care.  Consultants:  None  Procedures: None  Microbiology summarized: None  Objective: Vitals:   06/06/24 1333 06/06/24 2121 06/07/24 0524 06/07/24 1316  BP: (!) 153/61 (!) 173/75 (!) 149/88 (!) 169/70  Pulse: 66 62 61 69  Resp: 18 16 16 20   Temp: (!) 97.5 F (36.4 C) 98.1 F (36.7 C) 97.9 F (36.6 C) 97.7 F (36.5 C)  TempSrc: Oral Oral Oral   SpO2: 93% 92% 94% 93%  Weight:      Height:        Examination:  GENERAL: No apparent distress.  Nontoxic. HEENT: MMM.  Vision and hearing grossly intact.  NECK: Supple.  No apparent JVD.  RESP:  No IWOB.  Fair aeration bilaterally. CVS:  RRR. Heart sounds normal.  ABD/GI/GU: BS+. Abd soft, NTND.  MSK/EXT:  Moves extremities. No apparent deformity. No edema.  SKIN: no apparent skin lesion or wound NEURO: Awake and alert.  Oriented x 4.  No apparent focal neuro deficit. PSYCH: Calm. Normal affect.   Sch Meds:  Scheduled Meds:  acetaminophen   650 mg Oral Q6H WA   calcium -vitamin D   1 tablet Oral BID   carvedilol   12.5  mg Oral BID   heparin   5,000 Units Subcutaneous Q8H   lidocaine   1 patch Transdermal Q24H   losartan   12.5 mg Oral Daily   multivitamin with minerals  1 tablet Oral Daily   ondansetron  (ZOFRAN ) IV  4 mg Intravenous Once   pantoprazole   40 mg Oral QODAY   sodium chloride  flush  3 mL Intravenous Q12H   sodium chloride  flush  3 mL Intravenous Q12H   Continuous Infusions:   PRN Meds:.HYDROmorphone  (DILAUDID ) injection, methocarbamol  (ROBAXIN ) injection, ondansetron  **OR** ondansetron   (ZOFRAN ) IV, mouth rinse, oxyCODONE , oxyCODONE , polyethylene glycol, senna-docusate, sodium chloride  flush  Antimicrobials: Anti-infectives (From admission, onward)    None        I have personally reviewed the following labs and images: CBC: Recent Labs  Lab 06/04/24 2302 06/05/24 0554  WBC 9.6 8.6  HGB 13.5 11.9*  HCT 41.3 36.4  MCV 91.0 90.1  PLT 242 216   BMP &GFR Recent Labs  Lab 06/04/24 2302 06/05/24 0554  NA 139 138  K 4.1 3.8  CL 102 103  CO2 23 23  GLUCOSE 126* 119*  BUN 26* 22  CREATININE 0.82 0.82  CALCIUM  9.7 9.1   Estimated Creatinine Clearance: 32.2 mL/min (by C-G formula based on SCr of 0.82 mg/dL). Liver & Pancreas: Recent Labs  Lab 06/05/24 0554  AST 28  ALT 16  ALKPHOS 41  BILITOT 0.8  PROT 6.0*  ALBUMIN 3.6   No results for input(s): LIPASE, AMYLASE in the last 168 hours. No results for input(s): AMMONIA in the last 168 hours. Diabetic: No results for input(s): HGBA1C in the last 72 hours. No results for input(s): GLUCAP in the last 168 hours. Cardiac Enzymes: No results for input(s): CKTOTAL, CKMB, CKMBINDEX, TROPONINI in the last 168 hours. No results for input(s): PROBNP in the last 8760 hours. Coagulation Profile: No results for input(s): INR, PROTIME in the last 168 hours. Thyroid  Function Tests: No results for input(s): TSH, T4TOTAL, FREET4, T3FREE, THYROIDAB in the last 72 hours. Lipid Profile: No results for input(s): CHOL, HDL, LDLCALC, TRIG, CHOLHDL, LDLDIRECT in the last 72 hours. Anemia Panel: No results for input(s): VITAMINB12, FOLATE, FERRITIN, TIBC, IRON, RETICCTPCT in the last 72 hours. Urine analysis:    Component Value Date/Time   COLORURINE YELLOW 06/05/2022 1159   APPEARANCEUR CLEAR 06/05/2022 1159   LABSPEC 1.042 (H) 06/05/2022 1159   PHURINE 6.0 06/05/2022 1159   GLUCOSEU NEGATIVE 06/05/2022 1159   HGBUR SMALL (A) 06/05/2022 1159   BILIRUBINUR  NEGATIVE 06/05/2022 1159   KETONESUR 5 (A) 06/05/2022 1159   PROTEINUR NEGATIVE 06/05/2022 1159   UROBILINOGEN 0.2 03/12/2011 1541   NITRITE NEGATIVE 06/05/2022 1159   LEUKOCYTESUR LARGE (A) 06/05/2022 1159   Sepsis Labs: Invalid input(s): PROCALCITONIN, LACTICIDVEN  Microbiology: No results found for this or any previous visit (from the past 240 hours).  Radiology Studies: No results found.     Elson Ulbrich T. Bernerd Terhune [facility]  If 7PM-7AM, please contact night-coverage [email or web address] 06/07/2024, 1:25 PM   "

## 2024-06-08 DIAGNOSIS — W19XXXA Unspecified fall, initial encounter: Secondary | ICD-10-CM | POA: Diagnosis not present

## 2024-06-08 DIAGNOSIS — S2241XA Multiple fractures of ribs, right side, initial encounter for closed fracture: Secondary | ICD-10-CM | POA: Diagnosis not present

## 2024-06-08 DIAGNOSIS — I10 Essential (primary) hypertension: Secondary | ICD-10-CM | POA: Diagnosis not present

## 2024-06-08 DIAGNOSIS — R42 Dizziness and giddiness: Secondary | ICD-10-CM | POA: Diagnosis not present

## 2024-06-08 NOTE — Plan of Care (Signed)
  Problem: Education: Goal: Knowledge of General Education information will improve Description: Including pain rating scale, medication(s)/side effects and non-pharmacologic comfort measures Outcome: Progressing   Problem: Health Behavior/Discharge Planning: Goal: Ability to manage health-related needs will improve Outcome: Progressing   Problem: Clinical Measurements: Goal: Cardiovascular complication will be avoided Outcome: Progressing   Problem: Activity: Goal: Risk for activity intolerance will decrease Outcome: Progressing   Problem: Nutrition: Goal: Adequate nutrition will be maintained Outcome: Progressing   Problem: Coping: Goal: Level of anxiety will decrease Outcome: Progressing   Problem: Pain Managment: Goal: General experience of comfort will improve and/or be controlled Outcome: Progressing   Problem: Safety: Goal: Ability to remain free from injury will improve Outcome: Progressing

## 2024-06-08 NOTE — Plan of Care (Signed)

## 2024-06-08 NOTE — Progress Notes (Signed)
 " PROGRESS NOTE  Yolanda Phillips FMW:[id number] DOB: February 07, 1931   PCP: Leonel Cole, MD  Patient is from: Home.  DOA: 06/04/2024 LOS: 3  Chief complaints Chief Complaint  Patient presents with   Fall     Brief Narrative / Interim history: 89 year old F with PMH of HTN, chronic dizziness, osteoporosis, osteoarthritis, diverticulosis, GERD, IBS, HLD and nephrolithiasis sent to ED from urgent care after she was found to have 6 rib fractures.  Reportedly had accidental fall after hip/shoe code on the floor at home.  No prodromes leading to fall.  Denies hitting head or LOC.  Chest x-ray and CT chest showed fractures of posterior right 5th-10th ribs with minimal displacement.  Pelvic and lumbar x-ray without acute finding.  Patient was admitted for pain control and therapy evaluation.  Therapy recommended SNF.  Stable for discharge.  Subjective: Seen and examined earlier this morning.  No major events overnight or this morning.  Reports feeling better.  Pain improved.  Denies shortness of breath.   Assessment and plan: Accidental fall at home-reportedly fell after his shoe/foot was was caught on the floor Acute right-sided rib fracture 5th to 10th due to fall.  No flail chest or pneumothorax.  Vitamin D  level normal. -On room air.  Continue pulmonary toilet -Tylenol  650 mg every 6 hours while awake -Oxycodone  2.5 mg every 6 hours as needed moderate.  -Oxycodone  5 mg every 6 hours severe pain -IV Dilaudid  0.25 mg every 3 hours as needed breakthrough pain -Bowel regimen for constipation. -Fall precaution, incentive telemetry, OOB -PT/OT recommended SNF.  Essential hypertension: Markedly elevated BP likely due to pain.  Improved. -Pain control as above. -Continue home Coreg  and losartan    GERD -Continue Protonix    Chronic vertigo/dizziness: Denies. - Hold meclizine .  Body mass index is 24.24 kg/m.          DVT prophylaxis:  heparin  injection 5,000 Units Start: 06/05/24  0600 SCDs Start: 06/05/24 0157 Place TED hose Start: 06/05/24 0157  Code Status: Full code Family Communication: None at bedside Level of care: Telemetry Status is: Inpatient Remains inpatient appropriate because: Fall at home, traumatic rib fracture   Final disposition: SNF   35 minutes with more than 50% spent in reviewing records, counseling patient/family and coordinating care.  Consultants:  None  Procedures: None  Microbiology summarized: None  Objective: Vitals:   06/07/24 1316 06/07/24 2104 06/08/24 0553 06/08/24 1439  BP: (!) 169/70 (!) 148/69 (!) 152/73 (!) 144/89  Pulse: 69 66 60 62  Resp: 20 18 18 19   Temp: 97.7 F (36.5 C) (!) 97.5 F (36.4 C) 97.8 F (36.6 C) 97.8 F (36.6 C)  TempSrc:  Oral Oral Oral  SpO2: 93% 97% 95% 93%  Weight:      Height:        Examination:  GENERAL: No apparent distress.  Nontoxic. HEENT: MMM.  Vision and hearing grossly intact.  NECK: Supple.  No apparent JVD.  RESP:  No IWOB.  Fair aeration bilaterally. CVS:  RRR. Heart sounds normal.  ABD/GI/GU: BS+. Abd soft, NTND.  MSK/EXT:  Moves extremities. No apparent deformity. No edema.  SKIN: no apparent skin lesion or wound NEURO: Sleepy but wakes to voice.  Oriented appropriately.  No apparent focal neuro deficit. PSYCH: Calm. Normal affect.   Sch Meds:  Scheduled Meds:  acetaminophen   650 mg Oral Q6H WA   calcium -vitamin D   1 tablet Oral BID   carvedilol   12.5 mg Oral BID   heparin   5,000 Units  Subcutaneous Q8H   lidocaine   1 patch Transdermal Q24H   losartan   12.5 mg Oral Daily   multivitamin with minerals  1 tablet Oral Daily   ondansetron  (ZOFRAN ) IV  4 mg Intravenous Once   pantoprazole   40 mg Oral QODAY   sodium chloride  flush  3 mL Intravenous Q12H   sodium chloride  flush  3 mL Intravenous Q12H   Continuous Infusions:   PRN Meds:.HYDROmorphone  (DILAUDID ) injection, methocarbamol  (ROBAXIN ) injection, ondansetron  **OR** ondansetron  (ZOFRAN ) IV, mouth  rinse, oxyCODONE , oxyCODONE , polyethylene glycol, senna-docusate, sodium chloride  flush  Antimicrobials: Anti-infectives (From admission, onward)    None        I have personally reviewed the following labs and images: CBC: Recent Labs  Lab 06/04/24 2302 06/05/24 0554  WBC 9.6 8.6  HGB 13.5 11.9*  HCT 41.3 36.4  MCV 91.0 90.1  PLT 242 216   BMP &GFR Recent Labs  Lab 06/04/24 2302 06/05/24 0554  NA 139 138  K 4.1 3.8  CL 102 103  CO2 23 23  GLUCOSE 126* 119*  BUN 26* 22  CREATININE 0.82 0.82  CALCIUM  9.7 9.1   Estimated Creatinine Clearance: 32.2 mL/min (by C-G formula based on SCr of 0.82 mg/dL). Liver & Pancreas: Recent Labs  Lab 06/05/24 0554  AST 28  ALT 16  ALKPHOS 41  BILITOT 0.8  PROT 6.0*  ALBUMIN 3.6   No results for input(s): LIPASE, AMYLASE in the last 168 hours. No results for input(s): AMMONIA in the last 168 hours. Diabetic: No results for input(s): HGBA1C in the last 72 hours. No results for input(s): GLUCAP in the last 168 hours. Cardiac Enzymes: No results for input(s): CKTOTAL, CKMB, CKMBINDEX, TROPONINI in the last 168 hours. No results for input(s): PROBNP in the last 8760 hours. Coagulation Profile: No results for input(s): INR, PROTIME in the last 168 hours. Thyroid  Function Tests: No results for input(s): TSH, T4TOTAL, FREET4, T3FREE, THYROIDAB in the last 72 hours. Lipid Profile: No results for input(s): CHOL, HDL, LDLCALC, TRIG, CHOLHDL, LDLDIRECT in the last 72 hours. Anemia Panel: No results for input(s): VITAMINB12, FOLATE, FERRITIN, TIBC, IRON, RETICCTPCT in the last 72 hours. Urine analysis:    Component Value Date/Time   COLORURINE YELLOW 06/05/2022 1159   APPEARANCEUR CLEAR 06/05/2022 1159   LABSPEC 1.042 (H) 06/05/2022 1159   PHURINE 6.0 06/05/2022 1159   GLUCOSEU NEGATIVE 06/05/2022 1159   HGBUR SMALL (A) 06/05/2022 1159   BILIRUBINUR NEGATIVE 06/05/2022  1159   KETONESUR 5 (A) 06/05/2022 1159   PROTEINUR NEGATIVE 06/05/2022 1159   UROBILINOGEN 0.2 03/12/2011 1541   NITRITE NEGATIVE 06/05/2022 1159   LEUKOCYTESUR LARGE (A) 06/05/2022 1159   Sepsis Labs: Invalid input(s): PROCALCITONIN, LACTICIDVEN  Microbiology: No results found for this or any previous visit (from the past 240 hours).  Radiology Studies: No results found.     Lusia Greis T. Tyree Fluharty [facility]  If 7PM-7AM, please contact night-coverage [email or web address] 06/08/2024, 2:43 PM   "

## 2024-06-08 NOTE — Plan of Care (Signed)
  Problem: Nutrition: Goal: Adequate nutrition will be maintained Outcome: Progressing   Problem: Elimination: Goal: Will not experience complications related to bowel motility Outcome: Progressing Goal: Will not experience complications related to urinary retention Outcome: Progressing   Problem: Pain Managment: Goal: General experience of comfort will improve and/or be controlled Outcome: Progressing   Problem: Safety: Goal: Ability to remain free from injury will improve Outcome: Progressing

## 2024-06-08 NOTE — Progress Notes (Signed)
 Physical Therapy Treatment Patient Details Name: Yolanda Phillips MRN: [id number] DOB: [birth year] Today's Date: 06/08/2024   History of Present Illness 89 yo female admitted with R side posterior rib fxs #5-10 after sustaining a fall at home. Hx of essential HTN, nephrolithiasis, chronic dizziness, chronic back pain, osteoporosis    PT Comments  Pt continues very cooperative and with noted improvement in activity tolerance and level of assist for most tasks.  Pt requiring increased time but up to ambulate 32' with RW and up to chair for lunch - CNA aware.    If plan is discharge home, recommend the following: A lot of help with walking and/or transfers;A little help with bathing/dressing/bathroom;Assistance with cooking/housework;Assist for transportation;Help with stairs or ramp for entrance   Can travel by private vehicle     No  Equipment Recommendations  None recommended by PT    Recommendations for Other Services       Precautions / Restrictions Precautions Precautions: Fall Restrictions Weight Bearing Restrictions Per Provider Order: No     Mobility  Bed Mobility Overal bed mobility: Needs Assistance Bed Mobility: Supine to Sit     Supine to sit: Min assist, HOB elevated, Used rails     General bed mobility comments: Increased time, assist to bring trunk to upright    Transfers Overall transfer level: Needs assistance Equipment used: Rolling walker (2 wheels) Transfers: Sit to/from Stand Sit to Stand: From elevated surface, Min assist           General transfer comment: steadying assist to bring wt up and fwd and balance in initial standing with RW    Ambulation/Gait Ambulation/Gait assistance: Min assist Gait Distance (Feet): 65 Feet Assistive device: Rolling walker (2 wheels) Gait Pattern/deviations: Step-to pattern, Decreased step length - right, Decreased step length - left, Shuffle, Trunk flexed Gait velocity: decr     General Gait Details:  Increased time, steady assist, cues for position from [employer] Mobility     Tilt Bed    Modified Rankin (Stroke Patients Only)       Balance Overall balance assessment: Needs assistance Sitting-balance support: No upper extremity supported, Feet supported Sitting balance-Leahy Scale: Fair     Standing balance support: No upper extremity supported Standing balance-Leahy Scale: Fair                              Hotel Manager: Impaired Factors Affecting Communication: Hearing impaired  Cognition Arousal: Alert Behavior During Therapy: WFL for tasks assessed/performed   PT - Cognitive impairments: No apparent impairments                         Following commands: Intact      Cueing Cueing Techniques: Verbal cues  Exercises      General Comments        Pertinent Vitals/Pain Pain Assessment Pain Assessment: 0-10 Pain Score: 6  Pain Location: R ribs and back with movement Pain Descriptors / Indicators: Grimacing, Guarding, Sore Pain Intervention(s): Limited activity within patient's tolerance, Monitored during session, Premedicated before session    Home Living                          Prior Function            PT Goals (current goals  can now be found in the care plan section) Acute Rehab PT Goals Patient Stated Goal: less pain. return to PLOF PT Goal Formulation: With patient Time For Goal Achievement: 06/19/24 Potential to Achieve Goals: Good Progress towards PT goals: Progressing toward goals    Frequency    Min 3X/week      PT Plan      Co-evaluation              AM-PAC PT 6 Clicks Mobility   Outcome Measure  Help needed turning from your back to your side while in a flat bed without using bedrails?: A Lot Help needed moving from lying on your back to sitting on the side of a flat bed without using bedrails?: A Lot Help needed moving  to and from a bed to a chair (including a wheelchair)?: A Lot Help needed standing up from a chair using your arms (e.g., wheelchair or bedside chair)?: A Little Help needed to walk in hospital room?: A Little Help needed climbing 3-5 steps with a railing? : A Lot 6 Click Score: 14    End of Session Equipment Utilized During Treatment: Gait belt Activity Tolerance: Patient limited by fatigue;Patient limited by pain Patient left: in chair;with call bell/phone within reach;with chair alarm set Nurse Communication: Mobility status PT Visit Diagnosis: Unsteadiness on feet (R26.81);History of falling (Z91.81);Difficulty in walking, not elsewhere classified (R26.2)     Time: 8859-8841 PT Time Calculation (min) (ACUTE ONLY): 18 min  Charges:    $Gait Training: 8-22 mins PT General Charges $$ ACUTE PT VISIT: 1 Visit                     [facility] PT Acute Rehabilitation Services Office [phone]    Juvenal Umar 06/08/2024, 1:04 PM

## 2024-06-09 DIAGNOSIS — K589 Irritable bowel syndrome without diarrhea: Secondary | ICD-10-CM | POA: Diagnosis not present

## 2024-06-09 DIAGNOSIS — Y92009 Unspecified place in unspecified non-institutional (private) residence as the place of occurrence of the external cause: Secondary | ICD-10-CM | POA: Diagnosis not present

## 2024-06-09 DIAGNOSIS — K219 Gastro-esophageal reflux disease without esophagitis: Secondary | ICD-10-CM | POA: Diagnosis not present

## 2024-06-09 DIAGNOSIS — S2241XA Multiple fractures of ribs, right side, initial encounter for closed fracture: Secondary | ICD-10-CM | POA: Diagnosis not present

## 2024-06-09 DIAGNOSIS — W19XXXA Unspecified fall, initial encounter: Secondary | ICD-10-CM | POA: Diagnosis not present

## 2024-06-09 DIAGNOSIS — R42 Dizziness and giddiness: Secondary | ICD-10-CM | POA: Diagnosis not present

## 2024-06-09 DIAGNOSIS — I10 Essential (primary) hypertension: Secondary | ICD-10-CM | POA: Diagnosis not present

## 2024-06-09 MED ORDER — OXYCODONE HCL 5 MG PO TABS: 2.5000 mg | ORAL_TABLET | Freq: Four times a day (QID) | ORAL | 0 refills | Status: AC | PRN

## 2024-06-09 NOTE — Plan of Care (Signed)
" °  Problem: Education: Goal: Knowledge of General Education information will improve Description: Including pain rating scale, medication(s)/side effects and non-pharmacologic comfort measures Outcome: Progressing   Problem: Health Behavior/Discharge Planning: Goal: Ability to manage health-related needs will improve Outcome: Progressing   Problem: Clinical Measurements: Goal: Cardiovascular complication will be avoided Outcome: Progressing   Problem: Clinical Measurements: Goal: Will remain free from infection Outcome: Progressing   Problem: Clinical Measurements: Goal: Ability to maintain clinical measurements within normal limits will improve Outcome: Progressing Goal: Will remain free from infection Outcome: Progressing Goal: Diagnostic test results will improve Outcome: Progressing Goal: Respiratory complications will improve Outcome: Progressing Goal: Cardiovascular complication will be avoided Outcome: Progressing   Problem: Activity: Goal: Risk for activity intolerance will decrease Outcome: Progressing   Problem: Nutrition: Goal: Adequate nutrition will be maintained Outcome: Progressing   Problem: Coping: Goal: Level of anxiety will decrease Outcome: Progressing   Problem: Elimination: Goal: Will not experience complications related to bowel motility Outcome: Progressing Goal: Will not experience complications related to urinary retention Outcome: Progressing   Problem: Pain Managment: Goal: General experience of comfort will improve and/or be controlled Outcome: Progressing   Problem: Safety: Goal: Ability to remain free from injury will improve Outcome: Progressing   Problem: Skin Integrity: Goal: Risk for impaired skin integrity will decrease Outcome: Progressing   "

## 2024-06-09 NOTE — Plan of Care (Signed)

## 2024-06-09 NOTE — Progress Notes (Signed)
 " PROGRESS NOTE  Yolanda Phillips FMW:[id number] DOB: 12/23/1930   PCP: Leonel Cole, MD  Patient is from: Home.  DOA: 06/04/2024 LOS: 4  Chief complaints Chief Complaint  Patient presents with   Fall     Brief Narrative / Interim history: 89 year old F with PMH of HTN, chronic dizziness, osteoporosis, osteoarthritis, diverticulosis, GERD, IBS, HLD and nephrolithiasis sent to ED from urgent care after she was found to have 6 rib fractures.  Reportedly had accidental fall after hip/shoe code on the floor at home.  No prodromes leading to fall.  Denies hitting head or LOC.  Chest x-ray and CT chest showed fractures of posterior right 5th-10th ribs with minimal displacement.  Pelvic and lumbar x-ray without acute finding.  Patient was admitted for pain control and therapy evaluation.  Therapy recommended SNF.  Stable for discharge.  Subjective: Seen and examined earlier this morning.  No major events overnight or this morning.  Reports feeling better.  Took oxycodone  and slept last night.  Denies shortness of breath.  Sitting on bedside chair.   Assessment and plan: Accidental fall at home-reportedly fell after his shoe/foot was was caught on the floor Acute right-sided rib fracture 5th to 10th due to fall.  No flail chest or pneumothorax.  Vitamin D  level normal. -On room air.  Continue pulmonary toilet -Tylenol  650 mg every 6 hours while awake -Oxycodone  2.5 mg every 6 hours as needed moderate.  -Oxycodone  5 mg every 6 hours severe pain -Discontinue IV Dilaudid .  Has not needed this -Bowel regimen for constipation. -Fall precaution, incentive telemetry, OOB -PT/OT recommended SNF.  Essential hypertension: Markedly elevated BP likely due to pain.  Improved. -Pain control as above. -Continue home Coreg  and losartan  -Hold Aldactone.  Chronic vertigo/dizziness: Denies dizziness. - Continue holding meclizine .   GERD -Continue Protonix    Body mass index is 24.24 kg/m.           DVT prophylaxis:  heparin  injection 5,000 Units Start: 06/05/24 0600 SCDs Start: 06/05/24 0157 Place TED hose Start: 06/05/24 0157  Code Status: Full code Family Communication: None at bedside Level of care: Telemetry Status is: Inpatient Remains inpatient appropriate because: Fall at home, traumatic rib fracture   Final disposition: SNF   35 minutes with more than 50% spent in reviewing records, counseling patient/family and coordinating care.  Consultants:  None  Procedures: None  Microbiology summarized: None  Objective: Vitals:   06/08/24 1439 06/08/24 2115 06/09/24 0609 06/09/24 1431  BP: (!) 144/89 (!) 153/76 137/68 (!) 149/70  Pulse: 62 64 62 64  Resp: 19 18 18 15   Temp: 97.8 F (36.6 C) 98.4 F (36.9 C) 97.9 F (36.6 C) 97.6 F (36.4 C)  TempSrc: Oral Oral Oral Oral  SpO2: 93% 93% 95% 97%  Weight:      Height:        Examination:  GENERAL: No apparent distress.  Nontoxic. HEENT: MMM.  Vision and hearing grossly intact.  NECK: Supple.  No apparent JVD.  RESP:  No IWOB.  Fair aeration bilaterally. CVS:  RRR. Heart sounds normal.  ABD/GI/GU: BS+. Abd soft, NTND.  MSK/EXT:  Moves extremities. No apparent deformity. No edema.  SKIN: no apparent skin lesion or wound NEURO: Sleepy but wakes to voice.  Oriented appropriately.  No apparent focal neuro deficit. PSYCH: Calm. Normal affect.   Sch Meds:  Scheduled Meds:  acetaminophen   650 mg Oral Q6H WA   calcium -vitamin D   1 tablet Oral BID   carvedilol   12.5 mg  Oral BID   heparin   5,000 Units Subcutaneous Q8H   lidocaine   1 patch Transdermal Q24H   losartan   12.5 mg Oral Daily   multivitamin with minerals  1 tablet Oral Daily   ondansetron  (ZOFRAN ) IV  4 mg Intravenous Once   pantoprazole   40 mg Oral QODAY   sodium chloride  flush  3 mL Intravenous Q12H   sodium chloride  flush  3 mL Intravenous Q12H   Continuous Infusions:   PRN Meds:.HYDROmorphone  (DILAUDID ) injection, methocarbamol   (ROBAXIN ) injection, ondansetron  **OR** ondansetron  (ZOFRAN ) IV, mouth rinse, oxyCODONE , oxyCODONE , polyethylene glycol, senna-docusate, sodium chloride  flush  Antimicrobials: Anti-infectives (From admission, onward)    None        I have personally reviewed the following labs and images: CBC: Recent Labs  Lab 06/04/24 2302 06/05/24 0554  WBC 9.6 8.6  HGB 13.5 11.9*  HCT 41.3 36.4  MCV 91.0 90.1  PLT 242 216   BMP &GFR Recent Labs  Lab 06/04/24 2302 06/05/24 0554  NA 139 138  K 4.1 3.8  CL 102 103  CO2 23 23  GLUCOSE 126* 119*  BUN 26* 22  CREATININE 0.82 0.82  CALCIUM  9.7 9.1   Estimated Creatinine Clearance: 32.2 mL/min (by C-G formula based on SCr of 0.82 mg/dL). Liver & Pancreas: Recent Labs  Lab 06/05/24 0554  AST 28  ALT 16  ALKPHOS 41  BILITOT 0.8  PROT 6.0*  ALBUMIN 3.6   No results for input(s): LIPASE, AMYLASE in the last 168 hours. No results for input(s): AMMONIA in the last 168 hours. Diabetic: No results for input(s): HGBA1C in the last 72 hours. No results for input(s): GLUCAP in the last 168 hours. Cardiac Enzymes: No results for input(s): CKTOTAL, CKMB, CKMBINDEX, TROPONINI in the last 168 hours. No results for input(s): PROBNP in the last 8760 hours. Coagulation Profile: No results for input(s): INR, PROTIME in the last 168 hours. Thyroid  Function Tests: No results for input(s): TSH, T4TOTAL, FREET4, T3FREE, THYROIDAB in the last 72 hours. Lipid Profile: No results for input(s): CHOL, HDL, LDLCALC, TRIG, CHOLHDL, LDLDIRECT in the last 72 hours. Anemia Panel: No results for input(s): VITAMINB12, FOLATE, FERRITIN, TIBC, IRON, RETICCTPCT in the last 72 hours. Urine analysis:    Component Value Date/Time   COLORURINE YELLOW 06/05/2022 1159   APPEARANCEUR CLEAR 06/05/2022 1159   LABSPEC 1.042 (H) 06/05/2022 1159   PHURINE 6.0 06/05/2022 1159   GLUCOSEU NEGATIVE 06/05/2022  1159   HGBUR SMALL (A) 06/05/2022 1159   BILIRUBINUR NEGATIVE 06/05/2022 1159   KETONESUR 5 (A) 06/05/2022 1159   PROTEINUR NEGATIVE 06/05/2022 1159   UROBILINOGEN 0.2 03/12/2011 1541   NITRITE NEGATIVE 06/05/2022 1159   LEUKOCYTESUR LARGE (A) 06/05/2022 1159   Sepsis Labs: Invalid input(s): PROCALCITONIN, LACTICIDVEN  Microbiology: No results found for this or any previous visit (from the past 240 hours).  Radiology Studies: No results found.     Silvia Hightower T. Labrian Torregrossa [facility]  If 7PM-7AM, please contact night-coverage [email or web address] 06/09/2024, 4:25 PM   "

## 2024-06-10 DIAGNOSIS — I10 Essential (primary) hypertension: Secondary | ICD-10-CM | POA: Diagnosis not present

## 2024-06-10 DIAGNOSIS — R42 Dizziness and giddiness: Secondary | ICD-10-CM | POA: Diagnosis not present

## 2024-06-10 DIAGNOSIS — W19XXXA Unspecified fall, initial encounter: Secondary | ICD-10-CM | POA: Diagnosis not present

## 2024-06-10 DIAGNOSIS — S2241XA Multiple fractures of ribs, right side, initial encounter for closed fracture: Secondary | ICD-10-CM | POA: Diagnosis not present

## 2024-06-10 MED ORDER — ALUM & MAG HYDROXIDE-SIMETH 200-200-20 MG/5ML PO SUSP
30.0000 mL | Freq: Once | ORAL | Status: AC
  Administered 2024-06-10: 30 mL via ORAL
  Filled 2024-06-10: qty 30

## 2024-06-10 NOTE — TOC Progression Note (Addendum)
 Transition of Care [facility]) - Progression Note    Patient Details  Name: Yolanda Phillips MRN: [id number] Date of Birth: 08-25-1930  [facility] CM/SW Contact  Toy LITTIE Agar, RN Phone Number:[phone]  06/10/2024, 10:25 AM  Clinical Narrative:    CM at bedside to offer choice for SNF. CM introduces self explained role and reason for visit. Patient states that she is unaware of plan for SNF for short term rehab and would like for CM to speak to son Ozell.   1042 CM spoke with son Ozell to make aware of bed offers. Ozell is requesting that bed offers be emailed to him @ [email or web address] CM has provided call back number to give CM choice.   [address] shara initiated [place] [id number] 12/29 pending  1211 Son called CM with choice for SNF. Son is ok with [facility] or [facility]. CM to reach out to both facilities to determine bed availability.   1400 CM has reached out to Tonya with admissions at [facility] to determine bed availability. There is no answer, secure voicemail has been left.   1509 CM spoke with Bascom at [facility] to confirm facility is able to extend bed offer. Insurance auth updated with facility name.   Expected Discharge Plan: Home w Home Health Services Barriers to Discharge: Continued Medical Work up               Expected Discharge Plan and Services       Living arrangements for the past 2 months: Single Family Home                                       Social Drivers of Health (SDOH) Interventions SDOH Screenings   Food Insecurity: No Food Insecurity (06/05/2024)  Housing: Low Risk (06/05/2024)  Transportation Needs: No Transportation Needs (06/05/2024)  Utilities: Not At Risk (06/05/2024)  Social Connections: Moderately Integrated (06/05/2024)  Tobacco Use: Low Risk (06/05/2024)    Readmission Risk Interventions     No data to display

## 2024-06-10 NOTE — Progress Notes (Signed)
 " PROGRESS NOTE  Yolanda Phillips FMW:[id number] DOB: [birth year]   PCP: Leonel Cole, MD  Patient is from: Home.  DOA: 06/04/2024 LOS: 5  Chief complaints Chief Complaint  Patient presents with   Fall     Brief Narrative / Interim history: 89 year old F with PMH of HTN, chronic dizziness, osteoporosis, osteoarthritis, diverticulosis, GERD, IBS, HLD and nephrolithiasis sent to ED from urgent care after she was found to have 6 rib fractures.  Reportedly had accidental fall after hip/shoe code on the floor at home.  No prodromes leading to fall.  Denies hitting head or LOC.  Chest x-ray and CT chest showed fractures of posterior right 5th-10th ribs with minimal displacement.  Pelvic and lumbar x-ray without acute finding.  Patient was admitted for pain control and therapy evaluation.  Therapy recommended SNF.  Stable for discharge.  Subjective: Seen and examined earlier this morning.  No major events overnight or this morning.  Feels like she would like to burp but not able to do.  She thinks she might have indigestion.  She likes to try something for indigestion.   Assessment and plan: Accidental fall at home-reportedly fell after his shoe/foot was was caught on the floor Acute right-sided rib fracture 5th to 10th due to fall.  No flail chest or pneumothorax.  Vitamin D  level normal. -On room air.  Continue pulmonary toilet -Tylenol  650 mg every 6 hours while awake -Oxycodone  2.5 mg every 6 hours as needed moderate.  -Oxycodone  5 mg every 6 hours severe pain -Discontinue IV Dilaudid .  Has not needed this -Bowel regimen for constipation. -Fall precaution, incentive telemetry, OOB -PT/OT recommended SNF.  Essential hypertension: Markedly elevated BP likely due to pain.  Improved. -Pain control as above. -Continue home Coreg  and losartan  - Continue holding Aldactone.  Chronic vertigo/dizziness: Denies dizziness. - Continue holding meclizine .   GERD/indigestion -Continue  Protonix  -GI cocktail x 1   Body mass index is 24.24 kg/m.          DVT prophylaxis:  heparin  injection 5,000 Units Start: 06/05/24 0600 SCDs Start: 06/05/24 0157 Place TED hose Start: 06/05/24 0157  Code Status: Full code Family Communication: None at bedside Level of care: Telemetry Status is: Inpatient Remains inpatient appropriate because: Fall at home, traumatic rib fracture   Final disposition: SNF   35 minutes with more than 50% spent in reviewing records, counseling patient/family and coordinating care.  Consultants:  None  Procedures: None  Microbiology summarized: None  Objective: Vitals:   06/09/24 0609 06/09/24 1431 06/09/24 2105 06/10/24 0528  BP: 137/68 (!) 149/70 130/64 (!) 150/64  Pulse: 62 64 63 62  Resp: 18 15 18 18   Temp: 97.9 F (36.6 C) 97.6 F (36.4 C) 97.7 F (36.5 C) 97.6 F (36.4 C)  TempSrc: Oral Oral Oral Oral  SpO2: 95% 97% 95% 95%  Weight:      Height:        Examination:  GENERAL: No apparent distress.  Nontoxic.  Sitting on bedside chair. HEENT: MMM.  Vision and hearing grossly intact.  NECK: Supple.  No apparent JVD.  RESP:  No IWOB.  Fair aeration bilaterally. CVS:  RRR. Heart sounds normal.  ABD/GI/GU: BS+. Abd soft, NTND.  MSK/EXT:  Moves extremities. No apparent deformity. No edema.  SKIN: no apparent skin lesion or wound NEURO: Sleepy but wakes to voice.  Oriented appropriately.  No apparent focal neuro deficit. PSYCH: Calm. Normal affect.   Sch Meds:  Scheduled Meds:  acetaminophen   650 mg Oral  Q6H WA   calcium -vitamin D   1 tablet Oral BID   carvedilol   12.5 mg Oral BID   heparin   5,000 Units Subcutaneous Q8H   lidocaine   1 patch Transdermal Q24H   losartan   12.5 mg Oral Daily   multivitamin with minerals  1 tablet Oral Daily   ondansetron  (ZOFRAN ) IV  4 mg Intravenous Once   pantoprazole   40 mg Oral QODAY   sodium chloride  flush  3 mL Intravenous Q12H   sodium chloride  flush  3 mL Intravenous Q12H    Continuous Infusions:   PRN Meds:.HYDROmorphone  (DILAUDID ) injection, methocarbamol  (ROBAXIN ) injection, ondansetron  **OR** ondansetron  (ZOFRAN ) IV, mouth rinse, oxyCODONE , oxyCODONE , polyethylene glycol, senna-docusate, sodium chloride  flush  Antimicrobials: Anti-infectives (From admission, onward)    None        I have personally reviewed the following labs and images: CBC: Recent Labs  Lab 06/04/24 2302 06/05/24 0554  WBC 9.6 8.6  HGB 13.5 11.9*  HCT 41.3 36.4  MCV 91.0 90.1  PLT 242 216   BMP &GFR Recent Labs  Lab 06/04/24 2302 06/05/24 0554  NA 139 138  K 4.1 3.8  CL 102 103  CO2 23 23  GLUCOSE 126* 119*  BUN 26* 22  CREATININE 0.82 0.82  CALCIUM  9.7 9.1   Estimated Creatinine Clearance: 32.2 mL/min (by C-G formula based on SCr of 0.82 mg/dL). Liver & Pancreas: Recent Labs  Lab 06/05/24 0554  AST 28  ALT 16  ALKPHOS 41  BILITOT 0.8  PROT 6.0*  ALBUMIN 3.6   No results for input(s): LIPASE, AMYLASE in the last 168 hours. No results for input(s): AMMONIA in the last 168 hours. Diabetic: No results for input(s): HGBA1C in the last 72 hours. No results for input(s): GLUCAP in the last 168 hours. Cardiac Enzymes: No results for input(s): CKTOTAL, CKMB, CKMBINDEX, TROPONINI in the last 168 hours. No results for input(s): PROBNP in the last 8760 hours. Coagulation Profile: No results for input(s): INR, PROTIME in the last 168 hours. Thyroid  Function Tests: No results for input(s): TSH, T4TOTAL, FREET4, T3FREE, THYROIDAB in the last 72 hours. Lipid Profile: No results for input(s): CHOL, HDL, LDLCALC, TRIG, CHOLHDL, LDLDIRECT in the last 72 hours. Anemia Panel: No results for input(s): VITAMINB12, FOLATE, FERRITIN, TIBC, IRON, RETICCTPCT in the last 72 hours. Urine analysis:    Component Value Date/Time   COLORURINE YELLOW 06/05/2022 1159   APPEARANCEUR CLEAR 06/05/2022 1159   LABSPEC  1.042 (H) 06/05/2022 1159   PHURINE 6.0 06/05/2022 1159   GLUCOSEU NEGATIVE 06/05/2022 1159   HGBUR SMALL (A) 06/05/2022 1159   BILIRUBINUR NEGATIVE 06/05/2022 1159   KETONESUR 5 (A) 06/05/2022 1159   PROTEINUR NEGATIVE 06/05/2022 1159   UROBILINOGEN 0.2 03/12/2011 1541   NITRITE NEGATIVE 06/05/2022 1159   LEUKOCYTESUR LARGE (A) 06/05/2022 1159   Sepsis Labs: Invalid input(s): PROCALCITONIN, LACTICIDVEN  Microbiology: No results found for this or any previous visit (from the past 240 hours).  Radiology Studies: No results found.     Aveion Nguyen T. Amar [facility]  If 7PM-7AM, please contact night-coverage [email or web address] 06/10/2024, 1:55 PM   "

## 2024-06-10 NOTE — Progress Notes (Signed)
 Physical Therapy Treatment Patient Details Name: Yolanda Phillips MRN: [id number] DOB: [birth year] Today's Date: 06/10/2024   History of Present Illness 89 yo female admitted with R side posterior rib fxs #5-10 after sustaining a fall at home. Hx of essential HTN, nephrolithiasis, chronic dizziness, chronic back pain, osteoporosis    PT Comments  The patient reports improved mobility with less discomfort. Patient ambulated x 110' using Rw. Patient will benefit from continued inpatient follow up therapy, <3 hours/day     If plan is discharge home, recommend the following: A lot of help with walking and/or transfers;A little help with bathing/dressing/bathroom;Assistance with cooking/housework;Assist for transportation;Help with stairs or ramp for entrance   Can travel by private vehicle     Yes  Equipment Recommendations  None recommended by PT    Recommendations for Other Services       Precautions / Restrictions Precautions Precautions: Fall Precaution/Restrictions Comments: rib fractures Restrictions Weight Bearing Restrictions Per Provider Order: No     Mobility  Bed Mobility Overal bed mobility: Needs Assistance Bed Mobility: Supine to Sit, Sit to Supine     Supine to sit: Supervision Sit to supine: Min assist   General bed mobility comments: Increased time, assist legs onto bed    Transfers Overall transfer level: Needs assistance Equipment used: Rolling walker (2 wheels) Transfers: Sit to/from Stand Sit to Stand: From elevated surface, Contact guard assist           General transfer comment: extra time to rise and sit down from bed    Ambulation/Gait Ambulation/Gait assistance: Min assist Gait Distance (Feet): 110 Feet Assistive device: Rolling walker (2 wheels) Gait Pattern/deviations: Step-through pattern       General Gait Details: Increased time, steady assist, cues for position from [employer]             Wheelchair Mobility      Tilt Bed    Modified Rankin (Stroke Patients Only)       Balance   Sitting-balance support: No upper extremity supported, Feet supported Sitting balance-Leahy Scale: Fair     Standing balance support: No upper extremity supported Standing balance-Leahy Scale: Fair                              Hotel Manager: Impaired Factors Affecting Communication: Hearing impaired  Cognition Arousal: Alert Behavior During Therapy: WFL for tasks assessed/performed   PT - Cognitive impairments: No apparent impairments                         Following commands: Intact      Cueing Cueing Techniques: Verbal cues  Exercises      General Comments        Pertinent Vitals/Pain Pain Assessment Pain Score: 4  Pain Location: R ribs and back with movement Pain Descriptors / Indicators: Grimacing, Guarding, Sore Pain Intervention(s): Premedicated before session, Monitored during session    Home Living                          Prior Function            PT Goals (current goals can now be found in the care plan section) Progress towards PT goals: Progressing toward goals    Frequency    Min 3X/week      PT Plan      Co-evaluation  AM-PAC PT 6 Clicks Mobility   Outcome Measure  Help needed turning from your back to your side while in a flat bed without using bedrails?: A Little Help needed moving from lying on your back to sitting on the side of a flat bed without using bedrails?: A Little Help needed moving to and from a bed to a chair (including a wheelchair)?: A Little Help needed standing up from a chair using your arms (e.g., wheelchair or bedside chair)?: A Little Help needed to walk in hospital room?: A Little Help needed climbing 3-5 steps with a railing? : A Lot 6 Click Score: 17    End of Session Equipment Utilized During Treatment: Gait belt Activity Tolerance: Patient  tolerated treatment well Patient left: in bed;with call bell/phone within reach;with family/visitor present Nurse Communication: Mobility status PT Visit Diagnosis: Unsteadiness on feet (R26.81);History of falling (Z91.81);Difficulty in walking, not elsewhere classified (R26.2)     Time: 1510-1520 PT Time Calculation (min) (ACUTE ONLY): 10 min  Charges:    $Gait Training: 8-22 mins PT General Charges $$ ACUTE PT VISIT: 1 Visit          Darice Potters PT Acute Rehabilitation Services Office [phone]   Potters Darice Norris 06/10/2024, 3:55 PM

## 2024-06-10 NOTE — Plan of Care (Signed)

## 2024-06-11 DIAGNOSIS — W19XXXA Unspecified fall, initial encounter: Secondary | ICD-10-CM | POA: Diagnosis not present

## 2024-06-11 DIAGNOSIS — I10 Essential (primary) hypertension: Secondary | ICD-10-CM | POA: Diagnosis not present

## 2024-06-11 DIAGNOSIS — R42 Dizziness and giddiness: Secondary | ICD-10-CM | POA: Diagnosis not present

## 2024-06-11 DIAGNOSIS — S2241XA Multiple fractures of ribs, right side, initial encounter for closed fracture: Secondary | ICD-10-CM | POA: Diagnosis not present

## 2024-06-11 NOTE — Evaluation (Signed)
 Clinical/Bedside Swallow Evaluation Patient Details  Name: VERLIE LIOTTA MRN: [id number] Date of Birth: 03-18-1931  Today's Date: 06/11/2024 Time: SLP Start Time (ACUTE ONLY): 1631 SLP Stop Time (ACUTE ONLY): 1642 SLP Time Calculation (min) (ACUTE ONLY): 11 min  Past Medical History:  Past Medical History:  Diagnosis Date   Arthritis    Cancer (HCC)    removed skin cancer from her arm   Chronic back pain    Costochondritis    Diverticulosis    Diverticulosis    GERD (gastroesophageal reflux disease)    Hypercholesterolemia    IBS (irritable bowel syndrome)    Kidney stone    Osteoporosis    PONV (postoperative nausea and vomiting)    Past Surgical History:  Past Surgical History:  Procedure Laterality Date   ABDOMINAL HYSTERECTOMY     BREAST SURGERY Bilateral    implants   CHOLECYSTECTOMY     COLONOSCOPY     EYE SURGERY     bilateral cataracts   FACIAL COSMETIC SURGERY     20 + years ago   KYPHOPLASTY N/A 06/18/2015   Procedure: KYPHOPLASTY;  Surgeon: Oneil Priestly, MD;  Location: [facility] OR;  Service: Orthopedics;  Laterality: N/A;  Thoracic 9 kyphoplasty   MELANOMA EXCISION Right 01/14/2013   Procedure: WIDE LOCAL EXCISION RIGHT WRIST MELANOMA ;  Surgeon: Redell Faith, DO;  Location: [facility] OR;  Service: General;  Laterality: Right;   TONSILLECTOMY     HPI:  Violette Morneault is a 89 y.o. female who presented to the hospital from urgent care on 06/05/24 following a mechanical fall at home. CXR showed mutiple acute displaced right rib fractures. CT chest reported rib fractures right 5th-10th, chronic kyphosis; lungs showing chronic scarring but no focal consolidation or PE. SLP swallow evaluation ordered on 06/11/24 secondary to RN concerned about patient's swallowing. (Patient indicated that she had an incident today where she regurgitated some of the applesauce she was swallowing with meds) PMH: osteoporosis, osteoarthritis, diverticulosis, GERD, IBS, HLD.    Assessment / Plan /  Recommendation  Clinical Impression  Recommendation of OP MBS if patient desires to assess her swallow function. (She would like to focus on healing s/p rib fractures before considering MBS)  Patient presents with clinical s/s of dysphagia as per this swallow evaluation but patient indicated she has a chronic dysphagia characterized by difficulty swallowing pills. Per chart review, barium esophagram from 2009 reported: Marked dysmotility with intermittent tertiary contractions as well as small sliding hiatal hernia. She reported that her swallowing has worsened a little in the past several weeks but mainly amounts to difficulty swallowing larger pills. She has reportedly had some difficulty swallowing pills while hospitalized. She does appear kyphotic and SLP suspects difficulty positioning in hospital bed may be impacting her swallow here at the hospital.   SLP Visit Diagnosis: Dysphagia, unspecified (R13.10)    Aspiration Risk  Mild aspiration risk    Diet Recommendation Regular;Thin liquid    Liquid Administration via: Cup;Straw Medication Administration: Other (Comment) (as tolerated) Supervision: Patient able to self feed Compensations: Slow rate;Small sips/bites Postural Changes: Seated upright at 90 degrees;Remain upright for at least 30 minutes after po intake    Other Recommendations Oral Care Recommendations: Oral care BID;Patient independent with oral care     Swallow Evaluation Recommendations     Assistance Recommended at Discharge    Functional Status Assessment Patient has not had a recent decline in their functional status  Frequency and Duration  Prognosis        Swallow Study   General Date of Onset: 06/11/24 HPI: Kalea Perine is a 89 y.o. female who presented to the hospital from urgent care on 06/05/24 following a mechanical fall at home. CXR showed mutiple acute displaced right rib fractures. CT chest reported rib fractures right 5th-10th,  chronic kyphosis; lungs showing chronic scarring but no focal consolidation or PE. SLP swallow evaluation ordered on 06/11/24 secondary to RN concerned about patient's swallowing. (Patient indicated that she had an incident today where she regurgitated some of the applesauce she was swallowing with meds) PMH: osteoporosis, osteoarthritis, diverticulosis, GERD, IBS, HLD. Type of Study: Bedside Swallow Evaluation Previous Swallow Assessment: none found Diet Prior to this Study: Regular;Thin liquids (Level 0) Temperature Spikes Noted: No Respiratory Status: Room air History of Recent Intubation: No Behavior/Cognition: Alert;Cooperative;Pleasant mood Oral Cavity Assessment: Within Functional Limits Oral Care Completed by SLP: No Oral Cavity - Dentition: Adequate natural dentition Vision: Functional for self-feeding Self-Feeding Abilities: Able to feed self Patient Positioning: Upright in bed Baseline Vocal Quality: Normal Volitional Cough: Strong Volitional Swallow: Able to elicit    Oral/Motor/Sensory Function Overall Oral Motor/Sensory Function: Within functional limits   Ice Chips     Thin Liquid Thin Liquid: Impaired Presentation: Straw Pharyngeal  Phase Impairments: Other (comments) Other Comments: audible, effortful swallow    Nectar Thick     Honey Thick     Puree Puree: Not tested   Solid     Solid: Not tested      Norleen IVAR Blase, MA, CCC-SLP Speech Therapy  06/11/2024,5:03 PM

## 2024-06-11 NOTE — Progress Notes (Signed)
 Mobility Specialist - Progress Note   06/11/24 1027  Mobility  Activity Ambulated with assistance  Level of Assistance Minimal assist, patient does 75% or more  Assistive Device Front wheel walker  Distance Ambulated (ft) 120 ft  Activity Response Tolerated well  Mobility Referral Yes  Mobility visit 1 Mobility  Mobility Specialist Start Time (ACUTE ONLY) 1007  Mobility Specialist Stop Time (ACUTE ONLY) 1019  Mobility Specialist Time Calculation (min) (ACUTE ONLY) 12 min   Pt received in bed, agreed to ambulate. Steadying assist with RW required during session to avoid hitting wall rails. Pt c/o nausea and requested to return to bed. Call light left at side and all needs within reach.   Alfornia EDISON Mobility Specialist Acute Rehabilitation Services Phone: [phone] 06/11/2024, 10:29 AM

## 2024-06-11 NOTE — Care Management Important Message (Signed)
 Important Message  Patient Details IM Letter given. Name: Yolanda Phillips MRN: [id number] Date of Birth: 1931/02/25   Important Message Given:  Yes - Medicare IM     Melba Ates 06/11/2024, 7:54 AM

## 2024-06-11 NOTE — TOC Progression Note (Signed)
 Transition of Care [facility]) - Progression Note    Patient Details  Name: Yolanda Phillips MRN: [id number] Date of Birth: 29-Nov-1930  Transition of Care [facility] CM/SW Contact  Toy LITTIE Agar, RN Phone Number:[phone]  06/11/2024, 4:15 PM  Clinical Narrative:    Insurance shara is approved. CM notified Tonya with [facility]. Will plan for d/c in am. Message has been sent to MD and RN.    Expected Discharge Plan: Home w Home Health Services Barriers to Discharge: Continued Medical Work up               Expected Discharge Plan and Services       Living arrangements for the past 2 months: Single Family Home Expected Discharge Date: 06/11/24                                     Social Drivers of Health (SDOH) Interventions SDOH Screenings   Food Insecurity: No Food Insecurity (06/05/2024)  Housing: Low Risk (06/05/2024)  Transportation Needs: No Transportation Needs (06/05/2024)  Utilities: Not At Risk (06/05/2024)  Social Connections: Moderately Integrated (06/05/2024)  Tobacco Use: Low Risk (06/05/2024)    Readmission Risk Interventions     No data to display

## 2024-06-11 NOTE — Discharge Summary (Signed)
 "  Physician Discharge Summary  Yolanda Phillips FMW:[id number] DOB: 1931-02-24 DOA: 06/04/2024  PCP: Leonel Cole, MD  Admit date: 06/04/2024 Discharge date: 06/11/2024  Admitted From: Home Disposition: SNF Recommendations for Outpatient Follow-up:  Check CMP and CBC Reassess blood pressure and adjust meds as appropriate.  BP could be elevated in the setting of pain Please follow up on the following pending results: None   Discharge Condition: Stable CODE STATUS: Full code   Contact information for follow-up providers     Leonel Cole, MD. Schedule an appointment as soon as possible for a visit in 1 week(s).   Specialty: Family Medicine Contact information: [address] [place] [address] [phone]              Contact information for after-discharge care     Destination     [facility] [place], [place] .   Service: Skilled Nursing Contact information: [address] [place]  [address] [phone]                     Hospital course 89 year old F with PMH of HTN, chronic dizziness, osteoporosis, osteoarthritis, diverticulosis, GERD, IBS, HLD and nephrolithiasis sent to ED from urgent care after she was found to have 6 rib fractures.  Reportedly had accidental fall after hip/shoe code on the floor at home.  No prodromes leading to fall.  Denies hitting head or LOC.   Chest x-ray and CT chest showed fractures of posterior right 5th-10th ribs with minimal displacement.  Pelvic and lumbar x-ray without acute finding.  Patient was admitted for pain control and therapy evaluation.  Patient's pain improved and controlled with p.o. Tylenol  and as needed oxycodone .  Remained stable from respiratory standpoint.   Therapy recommended SNF.   See individual problem list below for more.   Problems addressed during this hospitalization Accidental fall at home-reportedly fell after his shoe/foot was was caught on the  floor Acute right-sided rib fracture 5th to 10th due to fall.  No flail chest or pneumothorax.  Vitamin D  level normal. -On room air.  Continue pulmonary toilet -Tylenol  650 mg every 6 hours while awake -Oxycodone  2.5 mg every 6 hours as needed moderate.  -Oxycodone  5 mg every 6 hours severe pain -Bowel regimen for constipation. -Fall precaution, incentive telemetry, OOB -PT/OT at SNF.   Essential hypertension: Markedly elevated BP likely due to pain.  Improved. -Pain control as above. -Continue home Coreg  and losartan  -Discontinue Aldactone.  High risk for hyperkalemia with losartan . -Adjust antihypertensive meds as appropriate once pain fully controlled   Chronic vertigo/dizziness: Denies dizziness. - Continue holding meclizine .  Uses sporadically.   GERD/indigestion -Continue Protonix    Body mass index is 24.24 kg/m.           Consultations: None  Time spent 35  minutes  Vital signs Vitals:   06/10/24 2013 06/10/24 2014 06/11/24 0411 06/11/24 0840  BP: (!) 132/55 (!) 132/55 (!) 152/65   Pulse: 64 64 (!) 58   Temp: 97.6 F (36.4 C)  97.7 F (36.5 C)   Resp: 17  16 (!) 21  Height:      Weight:      SpO2: 93%  97%   TempSrc: Oral  Oral   BMI (Calculated):         Discharge exam  GENERAL: No apparent distress.  Nontoxic. HEENT: MMM.  Vision and hearing grossly intact.  NECK: Supple.  No apparent JVD.  RESP:  No IWOB.  Fair aeration bilaterally. CVS:  RRR. Heart sounds normal.  ABD/GI/GU: BS+. Abd soft, NTND.  MSK/EXT:  Moves extremities. No apparent deformity. No edema.  SKIN: no apparent skin lesion or wound NEURO: Awake and alert. Oriented appropriately.  No apparent focal neuro deficit. PSYCH: Calm. Normal affect.   Discharge Instructions  Allergies as of 06/11/2024       Reactions   Fosamax [alendronate Sodium] Other (See Comments)   GERD   Amlodipine Other (See Comments)   Leg pain   Boniva [ibandronate] Diarrhea   Bupropion Hcl Er (xl)  Other (See Comments)   Insomnia   Ciprofloxacin Other (See Comments)   It might have caused c-diff   Doxycycline  Other (See Comments)   C. Difficile (05/2022)   Evista [raloxifene] Diarrhea   Hydrochlorothiazide Other (See Comments)   Felt unwell?  Electrolytes?   Levbid [hyoscyamine] Other (See Comments)   Cramps   Nexium [esomeprazole Magnesium] Diarrhea   Norco [hydrocodone -acetaminophen ] Other (See Comments)   Severe constipation    Olmesartan Other (See Comments)   Felt unwell   Other Swelling, Other (See Comments), Hypertension   Table salt = swelling of the legs, too   Prevacid [lansoprazole] Diarrhea   Sulfamethoxazole-trimethoprim Swelling, Other (See Comments)   Facial swelling (08/2021)   Morphine  And Codeine Other (See Comments)   Made patient very dizzy        Medication List     STOP taking these medications    dicyclomine  10 MG capsule Commonly known as: BENTYL    meclizine  25 MG tablet Commonly known as: ANTIVERT    spironolactone 25 MG tablet Commonly known as: ALDACTONE       TAKE these medications    CALCIUM  + D3 PO Take 1 tablet by mouth daily after supper.   carvedilol  12.5 MG tablet Commonly known as: COREG  Take 6.25 mg by mouth See admin instructions. Take 6.25 mg by mouth after breakfast and supper   denosumab  60 MG/ML Sosy injection Commonly known as: PROLIA  Inject 60 mg into the skin every 6 (six) months.   losartan  25 MG tablet Commonly known as: COZAAR  Take 12.5-25 mg by mouth See admin instructions. Take 25 mg by mouth in the morning and 12.5 mg at bedtime   One A Day Women 50 Plus Tabs Take 1 tablet by mouth daily after supper.   oxyCODONE  5 MG immediate release tablet Commonly known as: Oxy IR/ROXICODONE  Take 0.5-1 tablets (2.5-5 mg total) by mouth every 6 (six) hours as needed for up to 5 days for moderate pain (pain score 4-6) or severe pain (pain score 7-10) (.).   pantoprazole  40 MG tablet Commonly known as:  PROTONIX  Take 40 mg by mouth See admin instructions. Take 40 mg by mouth 30 minutes before breakfast EVERY OTHER DAY   polyethylene glycol powder 17 GM/SCOOP powder Commonly known as: MiraLax  Take 17 g by mouth 2 (two) times daily as needed for mild constipation.   Refresh Optive Advanced PF 0.5-1-0.5 % Soln Generic drug: Carboxymeth-Glyc-Polysorb PF Place 1 drop into the left eye in the morning, at noon, in the evening, and at bedtime.   Refresh Optive Advanced 0.5-1-0.5 % Soln Generic drug: Carboxymeth-Glycerin-Polysorb Place 1 drop into the right eye in the morning, at noon, in the evening, and at bedtime.   senna-docusate 8.6-50 MG tablet Commonly known as: Senokot-S Take 1-2 tablets by mouth 2 (two) times daily between meals as needed for mild constipation or moderate constipation.   Tylenol  8 Hour 650 MG CR tablet  Generic drug: acetaminophen  Take 1 tablet (650 mg total) by mouth every 8 (eight) hours for 5 days, THEN 1 tablet (650 mg total) every 8 (eight) hours as needed for pain. Start taking on: June 06, 2024 What changed: See the new instructions.   vitamin B-12 500 MCG tablet Commonly known as: CYANOCOBALAMIN  Take 500 mcg by mouth daily after breakfast.   Vitamin D -3 25 MCG (1000 UT) Caps Take 1,000 Units by mouth daily after breakfast.         Procedures/Studies:   CT Chest W Contrast Result Date: 06/05/2024 EXAM: CT CHEST WITH CONTRAST 06/05/2024 12:55:21 AM TECHNIQUE: CT of the chest was performed with the administration of 75 mL of iohexol  (OMNIPAQUE ) 300 MG/ML solution. Multiplanar reformatted images are provided for review. Automated exposure control, iterative reconstruction, and/or weight based adjustment of the mA/kV was utilized to reduce the radiation dose to as low as reasonably achievable. COMPARISON: Same day x-ray. CLINICAL HISTORY: Chest trauma, blunt; assess rib fractures, r/o ptx, r/o scapula fracture. FINDINGS: MEDIASTINUM: Aortic and  coronary artery atherosclerotic calcifications. Heart and pericardium are otherwise unremarkable. The central airways are clear. LYMPH NODES: No mediastinal, hilar or axillary lymphadenopathy. LUNGS AND PLEURA: Chronic scarring in the lower lungs. No focal consolidation or pulmonary edema. No pleural effusion or pneumothorax. SOFT TISSUES/BONES: Mildly displaced fracture of the posterior lateral right 5th rib. Nondisplaced fracture of the posterior right 6th rib near the costovertebral junction. Mildly displaced fracture of the posterior right 7th rib near the costovertebral junction. Additional nondisplaced fracture of the posterior right 7th rib. Nondisplaced fracture of the posterior right 8th rib near the costovertebral junction and nondisplaced additional posterior right 8th rib fracture. Nondisplaced fractures of the posterior right 9th and 10th ribs near the costovertebral junction. No scapular fracture. Thoracic kyphosis. Chronic compression fractures of T8 and T9. Chronic fracture of T9. Vertebroplasty of T9. No acute abnormality of the soft tissues. UPPER ABDOMEN: Limited images of the upper abdomen demonstrates no acute abnormality. IMPRESSION: 1. Fractures of the posterior right 5th - 10th ribs as described. No scapular fracture or pneumothorax. Electronically signed by: Norman Gatlin MD 06/05/2024 01:08 AM EST RP Workstation: [id number]   DG Lumbar Spine Complete Result Date: 06/04/2024 EXAM: 4 OR MORE VIEW(S) XRAY OF THE LUMBAR SPINE 06/04/2024 10:21:00 PM COMPARISON: Comparison with 06/30/2022 CT. CLINICAL HISTORY: fall, pain FINDINGS: LUMBAR SPINE: BONES: Vertebral body heights are maintained. Left convex lumbar curve. Demineralization. DISCS AND DEGENERATIVE CHANGES: Moderate lower lumbar facet arthropathy. SOFT TISSUES: No acute abnormality. IMPRESSION: 1. No evidence of acute traumatic injury. Electronically signed by: Norman Gatlin MD 06/04/2024 10:30 PM EST RP Workstation: [id number]    DG Pelvis 1-2 Views Result Date: 06/04/2024 EXAM: 1 OR 2 VIEW(S) XRAY OF THE PELVIS 06/04/2024 10:21:00 PM COMPARISON: None available. CLINICAL HISTORY: fall, pain FINDINGS: BONES AND JOINTS: No acute fracture. No malalignment. Degenerative changes of the lower lumbar spine. Degenerative changes of the hips, left greater than right. SOFT TISSUES: Surgical clip overlies the right hemipelvis. IMPRESSION: 1. No acute fracture. Electronically signed by: Norman Gatlin MD 06/04/2024 10:29 PM EST RP Workstation: [id number]   DG Chest 2 View Result Date: 06/04/2024 CLINICAL DATA:  Right-sided rib fractures EXAM: DG CHEST 2V COMPARISON:  06/04/2024, 05/12/2024, chest CT 01/13/2023 FINDINGS: Aortic atherosclerosis. Mild cardiomegaly. Multiple acute appearing right sided rib fractures. No convincing pneumothorax. Slight hazy asymmetrical appearance of the right thorax compared to left, but no definitive pleural effusion on lateral view. Multiple chronic compression deformities of the thoracic spine with  post augmentation change. IMPRESSION: 1. Multiple acute displaced right rib fractures without definitive pneumothorax by radiography. 2. Slight asymmetrical hazy opacification of right thorax compared to left, without definite pleural fluid correlate on lateral view, could be secondary to soft tissue artifact, however given multiple right rib fractures, suggest correlation with chest CT. Electronically Signed   By: Luke Bun M.D.   On: 06/04/2024 20:30   DG Chest Portable 1 View Result Date: 05/12/2024 CLINICAL DATA:  Chest pain EXAM: PORTABLE CHEST 1 VIEW COMPARISON:  Chest x-ray 05/13/2022 FINDINGS: Aorta is ectatic with atherosclerotic calcifications, similar to prior. The heart is mildly enlarged. The lungs are clear. There is no pleural effusion or pneumothorax. No acute fractures are seen. IMPRESSION: 1. No active disease. 2. Mild cardiomegaly. Electronically Signed   By: Greig Pique M.D.   On:  05/12/2024 18:40   CT ANGIO HEAD NECK W WO CM Result Date: 05/12/2024 EXAM: CT HEAD WITHOUT CTA HEAD AND NECK WITH AND WITHOUT 05/12/2024 05:58:03 PM TECHNIQUE: CTA of the head and neck was performed with and without the administration of intravenous contrast. Noncontrast CT of the head with reconstructed 2-D images are also provided for review. Multiplanar 2D and/or 3D reformatted images are provided for review. Automated exposure control, iterative reconstruction, and/or weight based adjustment of the mA/kV was utilized to reduce the radiation dose to as low as reasonably achievable. COMPARISON: CT Head dated 11/10/2021 CLINICAL HISTORY: Headache, sudden, severe. FINDINGS: CT HEAD: BRAIN AND VENTRICLES: No acute intracranial hemorrhage. No mass effect or midline shift. No extra-axial fluid collection. Gray-white differentiation is maintained. No hydrocephalus. Moderate atrophy and advanced diffuse white matter disease is similar to the prior study. No hyperdense vessel is present. ORBITS: Bilateral lens replacements are noted. The globes and orbits are otherwise within normal limits. SINUSES: No acute abnormality. SOFT TISSUES AND SKULL: No acute abnormality. CTA NECK: AORTIC ARCH AND ARCH VESSELS: No dissection or arterial injury. Atherosclerotic calcifications are present at the aortic arch and at the origin of the left subclavian artery without significant stenosis. Focal calcification is present at the origin of the right subclavian artery without significant stenosis. CERVICAL CAROTID ARTERIES: No dissection, arterial injury, or hemodynamically significant stenosis by NASCET criteria. Calcification is present at the right carotid bifurcation without significant stenosis. CERVICAL VERTEBRAL ARTERIES: No dissection or arterial injury. The left vertebral artery is dominant. A 50% stenosis is present at the origin of the left vertebral artery. VISUALIZED LUNGS AND MEDIASTINUM: Unremarkable. SOFT TISSUES: No  acute abnormality. BONES: No acute abnormality. CTA HEAD: ANTERIOR CIRCULATION: Atherosclerotic calcifications are present within the cavernous internal carotid arteries bilaterally without significant stenoses. The right A1 segment is dominant. No significant stenosis of the anterior cerebral arteries. No significant stenosis of the middle cerebral arteries. No aneurysm. POSTERIOR CIRCULATION: The right vertebral artery essentially terminates at the PICA with a hypoplastic distal V4 segment with a hypoplastic versus high grade stenosis in the distal right V4 segment. . The basilar artery is small, essentially terminating at the superior cerebellar arteries. The posterior cerebral arteries are fetal type bilaterally. No significant stenosis of the posterior cerebral arteries. No significant stenosis of the basilar artery. The intracranial right V4 segment is hypoplastic with a hypoplastic versus high grade stenosis. . No aneurysm. OTHER: No dural venous sinus thrombosis on this non-dedicated study. IMPRESSION: 1. No acute intracranial hemorrhage. 2. Moderate cerebral atrophy and advanced diffuse white matter disease, unchanged. 3. Approximately 50% stenosis at the origin of the left vertebral artery. 4. Atherosclerotic calcifications  in the cavernous internal carotid arteries without significant stenosis. 5. Atherosclerotic calcifications at the aortic arch and the origins of the left and right subclavian arteries without significant stenosis. Electronically signed by: Lonni Necessary MD 05/12/2024 06:30 PM EST RP Workstation: [id number]       The results of significant diagnostics from this hospitalization (including imaging, microbiology, ancillary and laboratory) are listed below for reference.     Microbiology: No results found for this or any previous visit (from the past 240 hours).   Labs:  CBC: Recent Labs  Lab 06/04/24 2302 06/05/24 0554  WBC 9.6 8.6  HGB 13.5 11.9*  HCT 41.3 36.4   MCV 91.0 90.1  PLT 242 216   BMP &GFR Recent Labs  Lab 06/04/24 2302 06/05/24 0554  NA 139 138  K 4.1 3.8  CL 102 103  CO2 23 23  GLUCOSE 126* 119*  BUN 26* 22  CREATININE 0.82 0.82  CALCIUM  9.7 9.1   Estimated Creatinine Clearance: 32.2 mL/min (by C-G formula based on SCr of 0.82 mg/dL). Liver & Pancreas: Recent Labs  Lab 06/05/24 0554  AST 28  ALT 16  ALKPHOS 41  BILITOT 0.8  PROT 6.0*  ALBUMIN 3.6   No results for input(s): LIPASE, AMYLASE in the last 168 hours. No results for input(s): AMMONIA in the last 168 hours. Diabetic: No results for input(s): HGBA1C in the last 72 hours. No results for input(s): GLUCAP in the last 168 hours. Cardiac Enzymes: No results for input(s): CKTOTAL, CKMB, CKMBINDEX, TROPONINI in the last 168 hours. No results for input(s): PROBNP in the last 8760 hours. Coagulation Profile: No results for input(s): INR, PROTIME in the last 168 hours. Thyroid  Function Tests: No results for input(s): TSH, T4TOTAL, FREET4, T3FREE, THYROIDAB in the last 72 hours. Lipid Profile: No results for input(s): CHOL, HDL, LDLCALC, TRIG, CHOLHDL, LDLDIRECT in the last 72 hours. Anemia Panel: No results for input(s): VITAMINB12, FOLATE, FERRITIN, TIBC, IRON, RETICCTPCT in the last 72 hours. Urine analysis:    Component Value Date/Time   COLORURINE YELLOW 06/05/2022 1159   APPEARANCEUR CLEAR 06/05/2022 1159   LABSPEC 1.042 (H) 06/05/2022 1159   PHURINE 6.0 06/05/2022 1159   GLUCOSEU NEGATIVE 06/05/2022 1159   HGBUR SMALL (A) 06/05/2022 1159   BILIRUBINUR NEGATIVE 06/05/2022 1159   KETONESUR 5 (A) 06/05/2022 1159   PROTEINUR NEGATIVE 06/05/2022 1159   UROBILINOGEN 0.2 03/12/2011 1541   NITRITE NEGATIVE 06/05/2022 1159   LEUKOCYTESUR LARGE (A) 06/05/2022 1159   Sepsis Labs: Invalid input(s): PROCALCITONIN, LACTICIDVEN   SIGNED:  Dakari Cregger T Destry Dauber, MD  [facility] 06/11/2024, 10:46  AM   "

## 2024-06-11 NOTE — Plan of Care (Signed)

## 2024-06-12 DIAGNOSIS — W010XXA Fall on same level from slipping, tripping and stumbling without subsequent striking against object, initial encounter: Principal | ICD-10-CM

## 2024-06-12 NOTE — Plan of Care (Signed)
  Problem: Education: Goal: Knowledge of General Education information will improve Description: Including pain rating scale, medication(s)/side effects and non-pharmacologic comfort measures Outcome: Progressing   Problem: Health Behavior/Discharge Planning: Goal: Ability to manage health-related needs will improve Outcome: Progressing   Problem: Clinical Measurements: Goal: Ability to maintain clinical measurements within normal limits will improve Outcome: Progressing Goal: Will remain free from infection Outcome: Progressing Goal: Diagnostic test results will improve Outcome: Progressing Goal: Respiratory complications will improve Outcome: Progressing Goal: Cardiovascular complication will be avoided Outcome: Progressing   Problem: Activity: Goal: Risk for activity intolerance will decrease Outcome: Progressing   Problem: Nutrition: Goal: Adequate nutrition will be maintained Outcome: Progressing   Problem: Pain Managment: Goal: General experience of comfort will improve and/or be controlled Outcome: Progressing

## 2024-06-12 NOTE — Discharge Summary (Signed)
 "  Physician Discharge Summary  Yolanda Phillips FMW:[id number] DOB: 12-Aug-1930 DOA: 06/04/2024  PCP: Leonel Cole, MD  Admit date: 06/04/2024 Discharge date: 06/12/2024  Admitted From: Home Disposition: SNF Recommendations for Outpatient Follow-up:  Check CMP and CBC Reassess blood pressure and adjust meds as appropriate.  BP could be elevated in the setting of pain Please follow up on the following pending results: None   Discharge Condition: Stable CODE STATUS: Full code   Contact information for follow-up providers     Leonel Cole, MD. Schedule an appointment as soon as possible for a visit in 1 week(s).   Specialty: Family Medicine Contact information: [address] [place] [address] [phone]              Contact information for after-discharge care     Destination     [facility] [place], [place] .   Service: Skilled Nursing Contact information: [address] [place]  [phone]                     Hospital course 89 year old F with PMH of HTN, chronic dizziness, osteoporosis, osteoarthritis, diverticulosis, GERD, IBS, HLD and nephrolithiasis sent to ED from urgent care after she was found to have 6 rib fractures.  Reportedly had accidental fall after hip/shoe code on the floor at home.  No prodromes leading to fall.  Denies hitting head or LOC.   Chest x-ray and CT chest showed fractures of posterior right 5th-10th ribs with minimal displacement.  Pelvic and lumbar x-ray without acute finding.  Patient was admitted for pain control and therapy evaluation.  Patient's pain improved and controlled with p.o. Tylenol  and as needed oxycodone .  Remained stable from respiratory standpoint.   Therapy recommended SNF.   See individual problem list below for more.   Problems addressed during this hospitalization Accidental fall at home-reportedly fell after his shoe/foot was was caught on the  floor Acute right-sided rib fracture 5th to 10th due to fall.  No flail chest or pneumothorax.  Vitamin D  level normal. -On room air.  Continue pulmonary toilet -Tylenol  650 mg every 6 hours while awake -Oxycodone  2.5 mg every 6 hours as needed moderate.  -Oxycodone  5 mg every 6 hours severe pain -Bowel regimen for constipation. -Fall precaution, incentive telemetry, OOB -PT/OT at SNF.   Essential hypertension: Markedly elevated BP likely due to pain.  Improved. -Pain control as above. -Continue home Coreg  and losartan  -Discontinue Aldactone.  High risk for hyperkalemia with losartan . -Adjust antihypertensive meds as appropriate once pain fully controlled   Chronic vertigo/dizziness: Denies dizziness. - Continue holding meclizine .  Uses sporadically.   GERD/indigestion -Continue Protonix    Body mass index is 24.24 kg/m.           Consultations: None  Time spent 35  minutes  Vital signs Vitals:   06/11/24 0411 06/11/24 0840 06/11/24 1355 06/12/24 0658  BP: (!) 152/65  137/70 (!) 167/78  Pulse: (!) 58  (!) 59 62  Temp: 97.7 F (36.5 C)  98.3 F (36.8 C) 98.5 F (36.9 C)  Resp: 16 (!) 21 18   Height:      Weight:      SpO2: 97%  93% 94%  TempSrc: Oral  Oral Oral  BMI (Calculated):         Discharge exam  GENERAL: No apparent distress.  Nontoxic. HEENT: MMM.  Vision and hearing grossly intact.  NECK: Supple.  No apparent JVD.  RESP:  No IWOB.  Fair aeration bilaterally. CVS:  RRR. Heart sounds normal.  ABD/GI/GU: BS+. Abd soft, NTND.  MSK/EXT:  Moves extremities. No apparent deformity. No edema.  SKIN: no apparent skin lesion or wound NEURO: Awake and alert. Oriented appropriately.  No apparent focal neuro deficit. PSYCH: Calm. Normal affect.   Discharge Instructions  Allergies as of 06/12/2024       Reactions   Fosamax [alendronate Sodium] Other (See Comments)   GERD   Amlodipine Other (See Comments)   Leg pain   Boniva [ibandronate] Diarrhea    Bupropion Hcl Er (xl) Other (See Comments)   Insomnia   Ciprofloxacin Other (See Comments)   It might have caused c-diff   Doxycycline  Other (See Comments)   C. Difficile (05/2022)   Evista [raloxifene] Diarrhea   Hydrochlorothiazide Other (See Comments)   Felt unwell?  Electrolytes?   Levbid [hyoscyamine] Other (See Comments)   Cramps   Nexium [esomeprazole Magnesium] Diarrhea   Norco [hydrocodone -acetaminophen ] Other (See Comments)   Severe constipation    Olmesartan Other (See Comments)   Felt unwell   Other Swelling, Other (See Comments), Hypertension   Table salt = swelling of the legs, too   Prevacid [lansoprazole] Diarrhea   Sulfamethoxazole-trimethoprim Swelling, Other (See Comments)   Facial swelling (08/2021)   Morphine  And Codeine Other (See Comments)   Made patient very dizzy        Medication List     STOP taking these medications    dicyclomine  10 MG capsule Commonly known as: BENTYL    meclizine  25 MG tablet Commonly known as: ANTIVERT    spironolactone 25 MG tablet Commonly known as: ALDACTONE       TAKE these medications    CALCIUM  + D3 PO Take 1 tablet by mouth daily after supper.   carvedilol  12.5 MG tablet Commonly known as: COREG  Take 6.25 mg by mouth See admin instructions. Take 6.25 mg by mouth after breakfast and supper   denosumab  60 MG/ML Sosy injection Commonly known as: PROLIA  Inject 60 mg into the skin every 6 (six) months.   losartan  25 MG tablet Commonly known as: COZAAR  Take 12.5-25 mg by mouth See admin instructions. Take 25 mg by mouth in the morning and 12.5 mg at bedtime   One A Day Women 50 Plus Tabs Take 1 tablet by mouth daily after supper.   oxyCODONE  5 MG immediate release tablet Commonly known as: Oxy IR/ROXICODONE  Take 0.5-1 tablets (2.5-5 mg total) by mouth every 6 (six) hours as needed for up to 5 days for moderate pain (pain score 4-6) or severe pain (pain score 7-10) (.).   pantoprazole  40 MG  tablet Commonly known as: PROTONIX  Take 40 mg by mouth See admin instructions. Take 40 mg by mouth 30 minutes before breakfast EVERY OTHER DAY   polyethylene glycol powder 17 GM/SCOOP powder Commonly known as: MiraLax  Take 17 g by mouth 2 (two) times daily as needed for mild constipation.   Refresh Optive Advanced PF 0.5-1-0.5 % Soln Generic drug: Carboxymeth-Glyc-Polysorb PF Place 1 drop into the left eye in the morning, at noon, in the evening, and at bedtime.   Refresh Optive Advanced 0.5-1-0.5 % Soln Generic drug: Carboxymeth-Glycerin-Polysorb Place 1 drop into the right eye in the morning, at noon, in the evening, and at bedtime.   senna-docusate 8.6-50 MG tablet Commonly known as: Senokot-S Take 1-2 tablets by mouth 2 (two) times daily between meals as needed for mild constipation or moderate constipation.   Tylenol  8 Hour 650  MG CR tablet Generic drug: acetaminophen  Take 1 tablet (650 mg total) by mouth every 8 (eight) hours for 5 days, THEN 1 tablet (650 mg total) every 8 (eight) hours as needed for pain. Start taking on: June 06, 2024 What changed: See the new instructions.   vitamin B-12 500 MCG tablet Commonly known as: CYANOCOBALAMIN  Take 500 mcg by mouth daily after breakfast.   Vitamin D -3 25 MCG (1000 UT) Caps Take 1,000 Units by mouth daily after breakfast.         Procedures/Studies:   CT Chest W Contrast Result Date: 06/05/2024 EXAM: CT CHEST WITH CONTRAST 06/05/2024 12:55:21 AM TECHNIQUE: CT of the chest was performed with the administration of 75 mL of iohexol  (OMNIPAQUE ) 300 MG/ML solution. Multiplanar reformatted images are provided for review. Automated exposure control, iterative reconstruction, and/or weight based adjustment of the mA/kV was utilized to reduce the radiation dose to as low as reasonably achievable. COMPARISON: Same day x-ray. CLINICAL HISTORY: Chest trauma, blunt; assess rib fractures, r/o ptx, r/o scapula fracture. FINDINGS:  MEDIASTINUM: Aortic and coronary artery atherosclerotic calcifications. Heart and pericardium are otherwise unremarkable. The central airways are clear. LYMPH NODES: No mediastinal, hilar or axillary lymphadenopathy. LUNGS AND PLEURA: Chronic scarring in the lower lungs. No focal consolidation or pulmonary edema. No pleural effusion or pneumothorax. SOFT TISSUES/BONES: Mildly displaced fracture of the posterior lateral right 5th rib. Nondisplaced fracture of the posterior right 6th rib near the costovertebral junction. Mildly displaced fracture of the posterior right 7th rib near the costovertebral junction. Additional nondisplaced fracture of the posterior right 7th rib. Nondisplaced fracture of the posterior right 8th rib near the costovertebral junction and nondisplaced additional posterior right 8th rib fracture. Nondisplaced fractures of the posterior right 9th and 10th ribs near the costovertebral junction. No scapular fracture. Thoracic kyphosis. Chronic compression fractures of T8 and T9. Chronic fracture of T9. Vertebroplasty of T9. No acute abnormality of the soft tissues. UPPER ABDOMEN: Limited images of the upper abdomen demonstrates no acute abnormality. IMPRESSION: 1. Fractures of the posterior right 5th - 10th ribs as described. No scapular fracture or pneumothorax. Electronically signed by: Norman Gatlin MD 06/05/2024 01:08 AM EST RP Workstation: [id number]   DG Lumbar Spine Complete Result Date: 06/04/2024 EXAM: 4 OR MORE VIEW(S) XRAY OF THE LUMBAR SPINE 06/04/2024 10:21:00 PM COMPARISON: Comparison with 06/30/2022 CT. CLINICAL HISTORY: fall, pain FINDINGS: LUMBAR SPINE: BONES: Vertebral body heights are maintained. Left convex lumbar curve. Demineralization. DISCS AND DEGENERATIVE CHANGES: Moderate lower lumbar facet arthropathy. SOFT TISSUES: No acute abnormality. IMPRESSION: 1. No evidence of acute traumatic injury. Electronically signed by: Norman Gatlin MD 06/04/2024 10:30 PM EST RP  Workstation: [id number]   DG Pelvis 1-2 Views Result Date: 06/04/2024 EXAM: 1 OR 2 VIEW(S) XRAY OF THE PELVIS 06/04/2024 10:21:00 PM COMPARISON: None available. CLINICAL HISTORY: fall, pain FINDINGS: BONES AND JOINTS: No acute fracture. No malalignment. Degenerative changes of the lower lumbar spine. Degenerative changes of the hips, left greater than right. SOFT TISSUES: Surgical clip overlies the right hemipelvis. IMPRESSION: 1. No acute fracture. Electronically signed by: Norman Gatlin MD 06/04/2024 10:29 PM EST RP Workstation: [id number]   DG Chest 2 View Result Date: 06/04/2024 CLINICAL DATA:  Right-sided rib fractures EXAM: DG CHEST 2V COMPARISON:  06/04/2024, 05/12/2024, chest CT 01/13/2023 FINDINGS: Aortic atherosclerosis. Mild cardiomegaly. Multiple acute appearing right sided rib fractures. No convincing pneumothorax. Slight hazy asymmetrical appearance of the right thorax compared to left, but no definitive pleural effusion on lateral view. Multiple chronic compression deformities of the  thoracic spine with post augmentation change. IMPRESSION: 1. Multiple acute displaced right rib fractures without definitive pneumothorax by radiography. 2. Slight asymmetrical hazy opacification of right thorax compared to left, without definite pleural fluid correlate on lateral view, could be secondary to soft tissue artifact, however given multiple right rib fractures, suggest correlation with chest CT. Electronically Signed   By: Luke Bun M.D.   On: 06/04/2024 20:30       The results of significant diagnostics from this hospitalization (including imaging, microbiology, ancillary and laboratory) are listed below for reference.     Microbiology: No results found for this or any previous visit (from the past 240 hours).   Labs:  CBC: No results for input(s): WBC, NEUTROABS, HGB, HCT, MCV, PLT in the last 168 hours.  BMP &GFR No results for input(s): NA, K, CL, CO2,  GLUCOSE, BUN, CREATININE, CALCIUM , MG, PHOS in the last 168 hours.  Invalid input(s): GFRCG  Estimated Creatinine Clearance: 32.2 mL/min (by C-G formula based on SCr of 0.82 mg/dL). Liver & Pancreas: No results for input(s): AST, ALT, ALKPHOS, BILITOT, PROT, ALBUMIN in the last 168 hours.  No results for input(s): LIPASE, AMYLASE in the last 168 hours. No results for input(s): AMMONIA in the last 168 hours. Diabetic: No results for input(s): HGBA1C in the last 72 hours. No results for input(s): GLUCAP in the last 168 hours. Cardiac Enzymes: No results for input(s): CKTOTAL, CKMB, CKMBINDEX, TROPONINI in the last 168 hours. No results for input(s): PROBNP in the last 8760 hours. Coagulation Profile: No results for input(s): INR, PROTIME in the last 168 hours. Thyroid  Function Tests: No results for input(s): TSH, T4TOTAL, FREET4, T3FREE, THYROIDAB in the last 72 hours. Lipid Profile: No results for input(s): CHOL, HDL, LDLCALC, TRIG, CHOLHDL, LDLDIRECT in the last 72 hours. Anemia Panel: No results for input(s): VITAMINB12, FOLATE, FERRITIN, TIBC, IRON, RETICCTPCT in the last 72 hours. Urine analysis:    Component Value Date/Time   COLORURINE YELLOW 06/05/2022 1159   APPEARANCEUR CLEAR 06/05/2022 1159   LABSPEC 1.042 (H) 06/05/2022 1159   PHURINE 6.0 06/05/2022 1159   GLUCOSEU NEGATIVE 06/05/2022 1159   HGBUR SMALL (A) 06/05/2022 1159   BILIRUBINUR NEGATIVE 06/05/2022 1159   KETONESUR 5 (A) 06/05/2022 1159   PROTEINUR NEGATIVE 06/05/2022 1159   UROBILINOGEN 0.2 03/12/2011 1541   NITRITE NEGATIVE 06/05/2022 1159   LEUKOCYTESUR LARGE (A) 06/05/2022 1159   Sepsis Labs: Invalid input(s): PROCALCITONIN, LACTICIDVEN   SIGNED:  Almarie KANDICE Hoots, MD  [facility] 06/12/2024, 8:33 AM   "

## 2024-06-12 NOTE — TOC Transition Note (Addendum)
 Transition of Care [facility]) - Discharge Note   Patient Details  Name: OPLE GIRGIS MRN: [id number] Date of Birth: 23-Sep-1930  [facility] CM/SW Contact:  Toy LITTIE Agar, RN Phone Number:[phone]  06/12/2024, 10:45 AM   Clinical Narrative:    CM has confirmed with [facility] that facility is able to accept patient today. Son Michael at bedside can transport patient.  RN please call reprot to [facility]. [phone].       Barriers to Discharge: Continued Medical Work up   Patient Goals and CMS Choice Patient states their goals for this hospitalization and ongoing recovery are:: SNF to get stronger CMS Medicare.gov Compare Post Acute Care list provided to:: Patient Choice offered to / list presented to : Patient      Discharge Placement                       Discharge Plan and Services Additional resources added to the After Visit Summary for                                       Social Drivers of Health (SDOH) Interventions SDOH Screenings   Food Insecurity: No Food Insecurity (06/05/2024)  Housing: Low Risk (06/05/2024)  Transportation Needs: No Transportation Needs (06/05/2024)  Utilities: Not At Risk (06/05/2024)  Social Connections: Moderately Integrated (06/05/2024)  Tobacco Use: Low Risk (06/05/2024)     Readmission Risk Interventions     No data to display

## 2024-06-12 NOTE — Plan of Care (Signed)
